# Patient Record
Sex: Male | Born: 1965
Health system: Southern US, Community
[De-identification: ages and names within clinical notes are randomized; demographics above are authoritative.]

## PROBLEM LIST (undated history)

## (undated) DIAGNOSIS — C9200 Acute myeloblastic leukemia, not having achieved remission: Secondary | ICD-10-CM

## (undated) DIAGNOSIS — Z9484 Stem cells transplant status: Secondary | ICD-10-CM

## (undated) DIAGNOSIS — D89813 Graft-versus-host disease, unspecified: Secondary | ICD-10-CM

## (undated) HISTORY — DX: Graft-versus-host disease, unspecified: D89.813

## (undated) HISTORY — DX: Stem cells transplant status: Z94.84

## (undated) HISTORY — DX: Acute myeloblastic leukemia, not having achieved remission: C92.00

---

## 2021-01-20 ENCOUNTER — Telehealth: Payer: Self-pay | Admitting: Hematology and Oncology

## 2021-01-20 NOTE — Telephone Encounter (Signed)
I received a staff msg to schedule a new pt appt w/Dr. Lorenso Courier for AML. Referral is from Dr. Zenda Alpers at [facility] has been scheduled to see Dr. Lorenso Courier on 3/10 at 1pm. Appt date and time has been given to the pt's emergency contact. Aware to arrive 20 minutes early.

## 2021-01-30 ENCOUNTER — Encounter: Payer: Self-pay | Admitting: Hematology and Oncology

## 2021-01-30 ENCOUNTER — Inpatient Hospital Stay: Payer: BC Managed Care – PPO

## 2021-01-30 ENCOUNTER — Other Ambulatory Visit: Payer: Self-pay | Admitting: *Deleted

## 2021-01-30 ENCOUNTER — Other Ambulatory Visit: Payer: Self-pay

## 2021-01-30 ENCOUNTER — Inpatient Hospital Stay: Payer: BC Managed Care – PPO | Attending: Hematology and Oncology | Admitting: Hematology and Oncology

## 2021-01-30 VITALS — BP 99/66 | HR 93 | Temp 98.1°F | Resp 17 | Ht 72.0 in | Wt 172.3 lb

## 2021-01-30 DIAGNOSIS — Z800 Family history of malignant neoplasm of digestive organs: Secondary | ICD-10-CM | POA: Diagnosis not present

## 2021-01-30 DIAGNOSIS — C9200 Acute myeloblastic leukemia, not having achieved remission: Secondary | ICD-10-CM

## 2021-01-30 DIAGNOSIS — K5900 Constipation, unspecified: Secondary | ICD-10-CM | POA: Insufficient documentation

## 2021-01-30 DIAGNOSIS — Z8042 Family history of malignant neoplasm of prostate: Secondary | ICD-10-CM | POA: Insufficient documentation

## 2021-01-30 DIAGNOSIS — R519 Headache, unspecified: Secondary | ICD-10-CM | POA: Insufficient documentation

## 2021-01-30 DIAGNOSIS — Z79899 Other long term (current) drug therapy: Secondary | ICD-10-CM | POA: Insufficient documentation

## 2021-01-30 DIAGNOSIS — Z9484 Stem cells transplant status: Secondary | ICD-10-CM | POA: Insufficient documentation

## 2021-01-30 LAB — CBC WITH DIFFERENTIAL (CANCER CENTER ONLY)
Abs Immature Granulocytes: 0.01 10*3/uL (ref 0.00–0.07)
Basophils Absolute: 0 10*3/uL (ref 0.0–0.1)
Basophils Relative: 0 %
Eosinophils Absolute: 0 10*3/uL (ref 0.0–0.5)
Eosinophils Relative: 1 %
HCT: 22.8 % — ABNORMAL LOW (ref 39.0–52.0)
Hemoglobin: 8.2 g/dL — ABNORMAL LOW (ref 13.0–17.0)
Immature Granulocytes: 1 %
Lymphocytes Relative: 19 %
Lymphs Abs: 0.2 10*3/uL — ABNORMAL LOW (ref 0.7–4.0)
MCH: 37.4 pg — ABNORMAL HIGH (ref 26.0–34.0)
MCHC: 36 g/dL (ref 30.0–36.0)
MCV: 104.1 fL — ABNORMAL HIGH (ref 80.0–100.0)
Monocytes Absolute: 0.1 10*3/uL (ref 0.1–1.0)
Monocytes Relative: 5 %
Neutro Abs: 0.8 10*3/uL — ABNORMAL LOW (ref 1.7–7.7)
Neutrophils Relative %: 74 %
Platelet Count: 35 10*3/uL — ABNORMAL LOW (ref 150–400)
RBC: 2.19 MIL/uL — ABNORMAL LOW (ref 4.22–5.81)
RDW: 18.6 % — ABNORMAL HIGH (ref 11.5–15.5)
WBC Count: 1.1 10*3/uL — ABNORMAL LOW (ref 4.0–10.5)
nRBC: 0 % (ref 0.0–0.2)

## 2021-01-30 LAB — CMP (CANCER CENTER ONLY)
ALT: 94 U/L — ABNORMAL HIGH (ref 0–44)
AST: 61 U/L — ABNORMAL HIGH (ref 15–41)
Albumin: 3.4 g/dL — ABNORMAL LOW (ref 3.5–5.0)
Alkaline Phosphatase: 115 U/L (ref 38–126)
Anion gap: 5 (ref 5–15)
BUN: 17 mg/dL (ref 6–20)
CO2: 29 mmol/L (ref 22–32)
Calcium: 8.7 mg/dL — ABNORMAL LOW (ref 8.9–10.3)
Chloride: 101 mmol/L (ref 98–111)
Creatinine: 0.79 mg/dL (ref 0.61–1.24)
GFR, Estimated: >60 mL/min (ref >60)
Glucose, Bld: 151 mg/dL — ABNORMAL HIGH (ref 70–99)
Potassium: 4.9 mmol/L (ref 3.5–5.1)
Sodium: 135 mmol/L (ref 135–145)
Total Bilirubin: 1.3 mg/dL — ABNORMAL HIGH (ref 0.3–1.2)
Total Protein: 5.8 g/dL — ABNORMAL LOW (ref 6.5–8.1)

## 2021-01-30 LAB — SAMPLE TO BLOOD BANK

## 2021-01-30 LAB — LACTATE DEHYDROGENASE: LDH: 180 U/L (ref 98–192)

## 2021-01-30 LAB — MAGNESIUM: Magnesium: 1 mg/dL — ABNORMAL LOW (ref 1.7–2.4)

## 2021-01-30 LAB — URIC ACID: Uric Acid, Serum: 4.9 mg/dL (ref 3.7–8.6)

## 2021-01-30 MED ORDER — URSODIOL 300 MG PO CAPS: 300.0000 mg | ORAL_CAPSULE | Freq: Three times a day (TID) | ORAL | 2 refills | Status: DC

## 2021-01-30 MED ORDER — HYDROCORTISONE 10 MG PO TABS: 10.0000 mg | ORAL_TABLET | Freq: Two times a day (BID) | ORAL | 2 refills | Status: DC

## 2021-01-30 MED FILL — HYDROCORTISONE 10 MG TABLET: 10 | 30 days supply | Qty: 60 | Fill #0

## 2021-01-30 NOTE — Progress Notes (Signed)
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 Was initially treated at [facility]. 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 Started on Venetoclax/Decitabine 01/16/2021: established care with Dr. Linus Orn at [facility] 01/30/2021: established care with Dr. Lorenso Courier at [facility] for co-managed care.   CHIEF COMPLAINTS/PURPOSE OF CONSULTATION:  "Co-managed Care for AML "  HISTORY OF PRESENTING ILLNESS:  William Fritz 55 y.o. male with medical history significant for AML s/p MRD PBSCT with relapse who presents to establish for comanage care.  On review of the previous records William Fritz was initially diagnosed with AML in [place] in October 2020.  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  He underwent induction chemotherapy and subsequently went underwent a matched related donor (sister) stem cell transplant performed at [facility] on 12/26/2019.  Unfortunately in October 2021 the patient had a relapse and was started on venetoclax and decitabine therapy.  Due to concern for his ability to care for himself he needs to be in the care of another individual and he was moved to [place] to live with his current companion.  He established care with Dr. Linus Orn on 01/16/2021 at [facility], however due to the long driving distance from [place] the patient requested to establish with a hospital closer to home.  As such we happily agreed to comanage his care with Dr. Lin[facility] at [facility].  On exam today William Fritz is accompanied by his companion.  He does appear to have some degree of cognitive decline.  His companion answers  many of the questions for him.  He reports that he has been well since he moved [place] has been having no issues with nausea, vomiting, or diarrhea.  He currently denies any fevers, chills, sweats.  He reports that his appetite is good and that his weight has been stable.  He does occasionally have some constipation.  Otherwise he has been tolerating his decitabine and Venclexta well with no major issues.  A full 10 point ROS is listed below.  On further discussion he notes that his maternal grandmother died of stomach cancer and his father died of prostate cancer.  His mother has a history of Alzheimer's disease.  He currently has no children and he has a healthy sister who was his bone marrow donor.  He is a never smoker and never drinker.  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 No pertinent surgical history.  SOCIAL HISTORY: Social History   Socioeconomic History   Marital status: Single    Spouse name: Not on file   Number of children: 0   Years of education: Not on file   Highest education level: Not on file  Occupational History   Not on file  Tobacco Use   Smoking status: Never Smoker   Smokeless tobacco: Never Used  Substance and Sexual Activity  Alcohol use: Never   Drug use: Not on file   Sexual activity: Not on file  Other Topics Concern   Not on file  Social History Narrative   Not on file   Social Determinants of Health   Financial Resource Strain: Medium Risk   Difficulty of Paying Living Expenses: Somewhat hard  Food Insecurity: No Food Insecurity   Worried About Running Out of Food in the Last Year: Never true   Ran Out of Food in the Last  Year: Never true  Transportation Needs: Unmet Transportation Needs   Lack of Transportation (Medical): Yes   Lack of Transportation (Non-Medical): Yes  Physical Activity: Not on file  Stress: Not on file  Social Connections: Not on file  Intimate Partner Violence: Not on file    FAMILY HISTORY: Family History  Problem Relation Age of Onset   Alzheimer's disease Mother    Prostate cancer Father    Healthy Sister    Gastric cancer Maternal Grandmother     ALLERGIES:  has no allergies on file.  MEDICATIONS:  Current Outpatient Medications  Medication Sig Dispense Refill   calcium carbonate (OS-CAL) 1250 (500 Ca) MG chewable tablet Chew 1 tablet by mouth 2 (two) times daily.     Cholecalciferol 50 MCG (2000 UT) TABS Take 1 tablet by mouth daily.     cyanocobalamin 1000 MCG tablet Take 1 tablet by mouth daily.     dronabinol (MARINOL) 2.5 MG capsule      famciclovir (FAMVIR) 500 MG tablet Take 1 tablet by mouth daily.     hydrocortisone (CORTEF) 10 MG tablet Take 1 tablet (10 mg total) by mouth 2 (two) times daily. 60 tablet 2   magnesium oxide (MAG-OX) 400 MG tablet Take 800 mg by mouth 3 (three) times daily.     midodrine (PROAMATINE) 10 MG tablet Take 1 tablet by mouth 3 (three) times daily.     mirtazapine (REMERON) 15 MG tablet Take 1 tablet by mouth at bedtime.     omeprazole (PRILOSEC) 40 MG capsule Take 1 capsule by mouth 2 (two) times daily.     ondansetron (ZOFRAN) 4 MG tablet Take 1 tablet by mouth every 8 (eight) hours as needed.     ursodiol (ACTIGALL) 300 MG capsule Take 1 capsule (300 mg total) by mouth 3 (three) times daily. 90 capsule 2   venetoclax (VENCLEXTA) 100 MG tablet Take by mouth.     nystatin (MYCOSTATIN) 100000 UNIT/ML suspension Take by mouth.     prochlorperazine (COMPAZINE) 10 MG tablet Take 10 mg by mouth every 6 (six) hours as needed.     tacrolimus (PROGRAF) 0.5 MG capsule Take 5 capsules by mouth every morning. 5 capsules  every morning and 6 capsules every evening     No current facility-administered medications for this visit.   Facility-Administered Medications Ordered in Other Visits  Medication Dose Route Frequency Provider Last Rate Last Admin   magnesium sulfate IVPB 4 g 100 mL  4 g Intravenous Once Orson Slick, MD        REVIEW OF SYSTEMS:   Constitutional: ( - ) fevers, ( - )  chills , ( - ) night sweats Eyes: ( - ) blurriness of vision, ( - ) double vision, ( - ) watery eyes Ears, nose, mouth, throat, and face: ( - ) mucositis, ( - ) sore throat Respiratory: ( - ) cough, ( - ) dyspnea, ( - ) wheezes Cardiovascular: ( - ) palpitation, ( - )  chest discomfort, ( - ) lower extremity swelling Gastrointestinal:  ( - ) nausea, ( - ) heartburn, ( - ) change in bowel habits Skin: ( - ) abnormal skin rashes Lymphatics: ( - ) new lymphadenopathy, ( - ) easy bruising Neurological: ( - ) numbness, ( - ) tingling, ( - ) new weaknesses Behavioral/Psych: ( - ) mood change, ( - ) new changes  All other systems were reviewed with the patient and are negative.  PHYSICAL EXAMINATION: ECOG PERFORMANCE STATUS: 1 - Symptomatic but completely ambulatory  Vitals:   01/30/21 1256  BP: 99/66  Pulse: 93  Resp: 17  Temp: 98.1 F (36.7 C)  SpO2: 100%   Filed Weights   01/30/21 1256  Weight: 172 lb 4.8 oz (78.2 kg)    GENERAL: chronically ill appearing middle aged male in NAD  SKIN: skin color, texture, turgor are normal, no rashes or significant lesions EYES: conjunctiva are pink and non-injected, sclera clear OROPHARYNX: no exudate, no erythema; lips, buccal mucosa, and tongue normal  LUNGS: clear to auscultation and percussion with normal breathing effort HEART: regular rate & rhythm and no murmurs and no lower extremity edema Musculoskeletal: no cyanosis of digits and no clubbing  PSYCH: alert & oriented x 3, fluent speech NEURO: no focal motor/sensory deficits  LABORATORY DATA:  I have reviewed  the data as listed CBC Latest Ref Rng & Units 01/30/2021  WBC 4.0 - 10.5 K/uL 1.1(L)  Hemoglobin 13.0 - 17.0 g/dL 8.2(L)  Hematocrit 39.0 - 52.0 % 22.8(L)  Platelets 150 - 400 K/uL 35(L)    CMP Latest Ref Rng & Units 01/30/2021  Glucose 70 - 99 mg/dL 151(H)  BUN 6 - 20 mg/dL 17  Creatinine 0.61 - 1.24 mg/dL 0.79  Sodium 135 - 145 mmol/L 135  Potassium 3.5 - 5.1 mmol/L 4.9  Chloride 98 - 111 mmol/L 101  CO2 22 - 32 mmol/L 29  Calcium 8.9 - 10.3 mg/dL 8.7(L)  Total Protein 6.5 - 8.1 g/dL 5.8(L)  Total Bilirubin 0.3 - 1.2 mg/dL 1.3(H)  Alkaline Phos 38 - 126 U/L 115  AST 15 - 41 U/L 61(H)  ALT 0 - 44 U/L 94(H)    RADIOGRAPHIC STUDIES: No results found.  ASSESSMENT & PLAN William Fritz 55 y.o. male with medical history significant for AML s/p MRD PBSCT with relapse who presents to establish for comanage care.  After review the labs, the records, schedule the patient the findings most consistent with a relapsed AML status post stem cell transplant.  He is currently on palliative chemotherapy with decitabine and venetoclax.  He received his last cycle at [facility] from 2/28 to 3/4. His next cycle (Cycle 5) is due on 02/17/2021 and we will administer this here.   Today we discussed the concept of comanaged care.  Comanaged care is when the patient has a local primary provider who administers local support and therapy while expert advice and treatment recommendations are rendered by a cancer specialist at a large academic center.  In this arrangement we provide local support, labs, treatment, and emergency visits, however the major decisions regarding the course of treatment are decided by a physician at an academic medical center.  The patient voiced his understanding of comanaged care and was agreeable to proceeding forward with this care model.  # AML s/p MRD PBSCT with Relapse --patient is agreeable to continuing with co-managed care between myself and Dr. Linus Orn of  [facility] --agree with continued Decitabine/Venetoclax. Next cycle to  be administered on 02/17/2021 --patient to see Dr. Linus Orn with labs and bone marrow biopsy on 02/13/2021. We will plan to provide weekly labs (next labs 02/06/2021). The next labs will be on Monday prior to start of next cycle of decitabine.  --RTC on Monday 02/17/2021 prior to start of next cycle of decitabine.   Orders Placed This Encounter  Procedures   CBC with Differential ([place] Only)    Standing Status:   Standing    Number of Occurrences:   52    Standing Expiration Date:   01/30/2022   CMP ([place] only)    Standing Status:   Standing    Number of Occurrences:   52    Standing Expiration Date:   01/30/2022   Lactate dehydrogenase (LDH)    Standing Status:   Standing    Number of Occurrences:   52    Standing Expiration Date:   01/30/2022   Uric acid    Standing Status:   Standing    Number of Occurrences:   52    Standing Expiration Date:   01/30/2022   Magnesium    Standing Status:   Standing    Number of Occurrences:   52    Standing Expiration Date:   01/30/2022    All questions were answered. The patient knows to call the clinic with any problems, questions or concerns.  A total of more than 60 minutes were spent on this encounter and over half of that time was spent on counseling and coordination of care as outlined above.   Ledell Peoples, MD Department of Hematology/Oncology [place] at [facility] Phone: [phone] Pager: [phone] Email: [email or web address]  01/31/2021 12:00 PM

## 2021-01-30 NOTE — Progress Notes (Signed)
START OFF PATHWAY REGIMEN - Other   [id number]:Decitabine:   A cycle is every 28 days:     Decitabine   **Always confirm dose/schedule in your pharmacy ordering system**  Patient Characteristics: Intent of Therapy: Non-Curative / Palliative Intent, Discussed with Patient 

## 2021-01-30 NOTE — Progress Notes (Signed)
IV team paged to assess pt's central line. Pt has triple lumen central line. He has had this for 1.5 years. It was placed @ [facility] in [place]. All lumens flush easily with good blood return. Labs obtained from purple port-largest port. Flush with saline/heparin after. Protocol for care to be listed on 01/31/21 in Patient Care Coordination Note. Pt uses blue or green caps over the luer lock caps. He plans to bring some in to be used here for weekly central line care.

## 2021-01-31 ENCOUNTER — Ambulatory Visit (HOSPITAL_COMMUNITY)
Admission: RE | Admit: 2021-01-31 | Discharge: 2021-01-31 | Disposition: A | Discharge status: Home / Self Care | Payer: BC Managed Care – PPO | Source: Ambulatory Visit | Attending: Hematology and Oncology | Admitting: Hematology and Oncology

## 2021-01-31 ENCOUNTER — Inpatient Hospital Stay: Payer: BC Managed Care – PPO

## 2021-01-31 ENCOUNTER — Encounter: Payer: Self-pay | Admitting: General Practice

## 2021-01-31 ENCOUNTER — Other Ambulatory Visit: Payer: Self-pay | Admitting: Hematology and Oncology

## 2021-01-31 ENCOUNTER — Other Ambulatory Visit: Payer: Self-pay | Admitting: Medical Oncology

## 2021-01-31 VITALS — BP 98/60 | HR 90 | Temp 97.1°F | Resp 16 | Wt 172.3 lb

## 2021-01-31 DIAGNOSIS — C9200 Acute myeloblastic leukemia, not having achieved remission: Secondary | ICD-10-CM | POA: Diagnosis not present

## 2021-01-31 DIAGNOSIS — Z452 Encounter for adjustment and management of vascular access device: Secondary | ICD-10-CM

## 2021-01-31 MED ORDER — HEPARIN SOD (PORK) LOCK FLUSH 100 UNIT/ML IV SOLN
250.0000 [IU] | Freq: Once | INTRAVENOUS | Status: AC
  Administered 2021-01-31: 50 [IU] via INTRAVENOUS
  Filled 2021-01-31: qty 5

## 2021-01-31 MED ORDER — SODIUM CHLORIDE 0.9% FLUSH
3.0000 mL | INTRAVENOUS | Status: DC | PRN
  Administered 2021-01-31: 3 mL via INTRAVENOUS
  Filled 2021-01-31: qty 10

## 2021-01-31 MED ORDER — HEPARIN SOD (PORK) LOCK FLUSH 100 UNIT/ML IV SOLN
50.0000 [IU] | Freq: Once | INTRAVENOUS | Status: DC
  Filled 2021-01-31: qty 5

## 2021-01-31 MED ORDER — MAGNESIUM SULFATE 4 GM/100ML IV SOLN
4.0000 g | Freq: Once | INTRAVENOUS | Status: DC
  Filled 2021-01-31: qty 100

## 2021-01-31 MED ORDER — SODIUM CHLORIDE 0.9 % IV SOLN
INTRAVENOUS | Status: DC
  Filled 2021-01-31: qty 250

## 2021-01-31 MED ORDER — MAGNESIUM SULFATE 4 GM/100ML IV SOLN
4.0000 g | Freq: Once | INTRAVENOUS | Status: AC
  Administered 2021-01-31: 4 g via INTRAVENOUS
  Filled 2021-01-31: qty 100

## 2021-01-31 NOTE — Patient Instructions (Signed)
Hypomagnesemia Hypomagnesemia is a condition in which the level of magnesium in the blood is low. Magnesium is a mineral that is found in many foods. It is used in many different processes in the body. Hypomagnesemia can affect every organ in the body. In severe cases, it can cause life-threatening problems. What are the causes? This condition may be caused by:  Not getting enough magnesium in your diet.  Malnutrition.  Problems with absorbing magnesium from the intestines.  Dehydration.  Alcohol abuse.  Vomiting.  Severe or chronic diarrhea.  Some medicines, including medicines that make you urinate more (diuretics).  Certain diseases, such as kidney disease, diabetes, celiac disease, and overactive thyroid. What are the signs or symptoms? Symptoms of this condition include:  Loss of appetite.  Nausea and vomiting.  Involuntary shaking or trembling of a body part (tremor).  Muscle weakness.  Tingling in the arms and legs.  Sudden tightening of muscles (muscle spasms).  Confusion.  Psychiatric issues, such as depression, irritability, or psychosis.  A feeling of fluttering of the heart.  Seizures. These symptoms are more severe if magnesium levels drop suddenly. How is this diagnosed? This condition may be diagnosed based on:  Your symptoms and medical history.  A physical exam.  Blood and urine tests. How is this treated? Treatment depends on the cause and the severity of the condition. It may be treated with:  A magnesium supplement. This can be taken in pill form. If the condition is severe, magnesium is usually given through an IV.  Changes to your diet. You may be directed to eat foods that have a lot of magnesium, such as green leafy vegetables, peas, beans, and nuts.  Stopping any intake of alcohol.   Follow these instructions at home:  Make sure that your diet includes foods with magnesium. Foods that have a lot of magnesium in them  include: ? Green leafy vegetables, such as spinach and broccoli. ? Beans and peas. ? Nuts and seeds, such as almonds and sunflower seeds. ? Whole grains, such as whole grain bread and fortified cereals.  Take magnesium supplements if your health care provider tells you to do that. Take them as directed.  Take over-the-counter and prescription medicines only as told by your health care provider.  Have your magnesium levels monitored as told by your health care provider.  When you are active, drink fluids that contain electrolytes.  Avoid drinking alcohol.  Keep all follow-up visits as told by your health care provider. This is important.      Contact a health care provider if:  You get worse instead of better.  Your symptoms return. Get help right away if you:  Develop severe muscle weakness.  Have trouble breathing.  Feel that your heart is racing. Summary  Hypomagnesemia is a condition in which the level of magnesium in the blood is low.  Hypomagnesemia can affect every organ in the body.  Treatment may include eating more foods that contain magnesium, taking magnesium supplements, and not drinking alcohol.  Have your magnesium levels monitored as told by your health care provider. This information is not intended to replace advice given to you by your health care provider. Make sure you discuss any questions you have with your health care provider. Document Revised: 04/11/2020 Document Reviewed: 04/11/2020 Elsevier Patient Education  2021 Elsevier Inc.  

## 2021-01-31 NOTE — Progress Notes (Signed)
[place] CSW Progress Notes  Called patient at request of [employer] copy.  Patient needs help w transportation, Adv Directives and other legal matters.  Is new to [facility], will have comanaged care w [facility].  [place]oved here [place], lives w significant other who is heavily involved in his care.  Referred to [facility] Transportation for help getting to/from [facility] appointments.  Registered him for 3/14 [facility].  Provided information on legal options for personal wills in [place].  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  He is ineligible for SSI due to being over assets.  Provided information on Triage Cancer, a national organization which helps people with financial and legal aspects of cancer.  Suggested they contact Leukemia and Lymphoma Society for additional support.  They would like to be connected w [place] asked to connect w him.  CSW will continue to connect w patient - will call in one week.  Edwyna Shell, LCSW Clinical Social Worker Phone:  [phone]

## 2021-02-03 ENCOUNTER — Other Ambulatory Visit: Payer: Self-pay

## 2021-02-03 ENCOUNTER — Other Ambulatory Visit: Payer: BC Managed Care – PPO | Admitting: *Deleted

## 2021-02-03 ENCOUNTER — Inpatient Hospital Stay: Payer: BC Managed Care – PPO | Admitting: *Deleted

## 2021-02-03 DIAGNOSIS — C9200 Acute myeloblastic leukemia, not having achieved remission: Secondary | ICD-10-CM

## 2021-02-03 NOTE — Progress Notes (Signed)
[place] Social Work  Clinical Social Work was referred to review and complete healthcare advance directives.  Clinical Social Worker met with patient and patients significant other in [facility] Patient and [employer].  The patient designated Retta Mac der Matilde Bash as their primary healthcare agent and Su Grand as their secondary agent.  Patient also completed healthcare living will.    Clinical Social Worker notarized documents and made copies for patient/family. Clinical Social Worker will send documents to medical records to be scanned into patient's chart. Clinical Social Worker encouraged patient/family to contact with any additional questions or concerns.  Johnnye Lana, MSW, LCSW, OSW-C Clinical Social Worker [facility] [phone]

## 2021-02-04 ENCOUNTER — Other Ambulatory Visit: Payer: Self-pay | Admitting: Hematology and Oncology

## 2021-02-04 ENCOUNTER — Encounter: Payer: Self-pay | Admitting: Hematology and Oncology

## 2021-02-04 MED ORDER — MIDODRINE HCL 10 MG PO TABS: 10.0000 mg | ORAL_TABLET | Freq: Three times a day (TID) | ORAL | 1 refills | Status: DC

## 2021-02-04 MED ORDER — HYDROCORTISONE 10 MG PO TABS: 10.0000 mg | ORAL_TABLET | Freq: Two times a day (BID) | ORAL | 2 refills | Status: DC

## 2021-02-04 MED ORDER — MIRTAZAPINE 15 MG PO TABS: 15.0000 mg | ORAL_TABLET | Freq: Every day | ORAL | 1 refills | Status: DC

## 2021-02-04 MED ORDER — CYANOCOBALAMIN 1000 MCG PO TABS: 1000.0000 ug | ORAL_TABLET | Freq: Every day | ORAL | 1 refills | Status: AC

## 2021-02-04 MED ORDER — CALCIUM CARBONATE 1250 (500 CA) MG PO CHEW: 1.0000 | CHEWABLE_TABLET | Freq: Two times a day (BID) | ORAL | 1 refills | Status: DC

## 2021-02-04 MED ORDER — CHOLECALCIFEROL 50 MCG (2000 UT) PO TABS: 1.0000 | ORAL_TABLET | Freq: Every day | ORAL | 1 refills | Status: DC

## 2021-02-04 MED ORDER — FAMCICLOVIR 500 MG PO TABS: 500.0000 mg | ORAL_TABLET | Freq: Every day | ORAL | 1 refills | Status: DC

## 2021-02-04 MED ORDER — OMEPRAZOLE 40 MG PO CPDR: 40.0000 mg | DELAYED_RELEASE_CAPSULE | Freq: Two times a day (BID) | ORAL | 1 refills | Status: DC

## 2021-02-05 ENCOUNTER — Telehealth: Payer: Self-pay | Admitting: Hematology and Oncology

## 2021-02-05 NOTE — Telephone Encounter (Signed)
Called patient regarding 03/18 appointments, left a contact number for patient to reach.

## 2021-02-05 NOTE — Telephone Encounter (Signed)
Called and spoke with William Fritz to inform of 3/18 appts and also to let advise that we are waiting for approval for next weeks appts. C

## 2021-02-06 ENCOUNTER — Encounter: Payer: Self-pay | Admitting: General Practice

## 2021-02-06 ENCOUNTER — Other Ambulatory Visit: Payer: Self-pay | Admitting: Hematology and Oncology

## 2021-02-06 NOTE — Progress Notes (Signed)
[place] CSW Progress Notes  Message from [employer] stating that significant other wants SW to determine status of shower chair ordered for patient by provider at [facility], nurse navigator, [phone]).  CSW left T Loletha Grayer a VM requesting that she call significant other w an update, also left VM for Ms Denton Lank asking her to let us know when she receives the shower chair.  CSW is unable to be of further assistance, as the DME company is unknown.  Edwyna Shell, LCSW Clinical Social Worker Phone:  [phone]

## 2021-02-07 ENCOUNTER — Inpatient Hospital Stay: Payer: BC Managed Care – PPO

## 2021-02-07 ENCOUNTER — Encounter: Payer: Self-pay | Admitting: General Practice

## 2021-02-07 ENCOUNTER — Encounter: Payer: Self-pay | Admitting: Hematology and Oncology

## 2021-02-07 ENCOUNTER — Telehealth: Payer: Self-pay | Admitting: *Deleted

## 2021-02-07 ENCOUNTER — Other Ambulatory Visit: Payer: Self-pay | Admitting: *Deleted

## 2021-02-07 ENCOUNTER — Other Ambulatory Visit: Payer: Self-pay

## 2021-02-07 ENCOUNTER — Inpatient Hospital Stay (HOSPITAL_BASED_OUTPATIENT_CLINIC_OR_DEPARTMENT_OTHER): Payer: BC Managed Care – PPO | Admitting: Hematology and Oncology

## 2021-02-07 VITALS — BP 103/80 | HR 83 | Temp 98.2°F | Resp 18 | Ht 72.0 in | Wt 176.4 lb

## 2021-02-07 DIAGNOSIS — C9200 Acute myeloblastic leukemia, not having achieved remission: Secondary | ICD-10-CM

## 2021-02-07 DIAGNOSIS — Z7189 Other specified counseling: Secondary | ICD-10-CM | POA: Diagnosis not present

## 2021-02-07 LAB — CMP (CANCER CENTER ONLY)
ALT: 151 U/L — ABNORMAL HIGH (ref 0–44)
AST: 79 U/L — ABNORMAL HIGH (ref 15–41)
Albumin: 3.3 g/dL — ABNORMAL LOW (ref 3.5–5.0)
Alkaline Phosphatase: 124 U/L (ref 38–126)
Anion gap: 5 (ref 5–15)
BUN: 20 mg/dL (ref 6–20)
CO2: 29 mmol/L (ref 22–32)
Calcium: 8.7 mg/dL — ABNORMAL LOW (ref 8.9–10.3)
Chloride: 102 mmol/L (ref 98–111)
Creatinine: 0.78 mg/dL (ref 0.61–1.24)
GFR, Estimated: >60 mL/min (ref >60)
Glucose, Bld: 132 mg/dL — ABNORMAL HIGH (ref 70–99)
Potassium: 4.1 mmol/L (ref 3.5–5.1)
Sodium: 136 mmol/L (ref 135–145)
Total Bilirubin: 1.4 mg/dL — ABNORMAL HIGH (ref 0.3–1.2)
Total Protein: 5.6 g/dL — ABNORMAL LOW (ref 6.5–8.1)

## 2021-02-07 LAB — CBC WITH DIFFERENTIAL (CANCER CENTER ONLY)
Abs Immature Granulocytes: 0 10*3/uL (ref 0.00–0.07)
Basophils Absolute: 0 10*3/uL (ref 0.0–0.1)
Basophils Relative: 0 %
Eosinophils Absolute: 0 10*3/uL (ref 0.0–0.5)
Eosinophils Relative: 0 %
HCT: 15.2 % — ABNORMAL LOW (ref 39.0–52.0)
Hemoglobin: 5.6 g/dL — CL (ref 13.0–17.0)
Immature Granulocytes: 0 %
Lymphocytes Relative: 95 %
Lymphs Abs: 0.2 10*3/uL — ABNORMAL LOW (ref 0.7–4.0)
MCH: 37.8 pg — ABNORMAL HIGH (ref 26.0–34.0)
MCHC: 36.8 g/dL — ABNORMAL HIGH (ref 30.0–36.0)
MCV: 102.7 fL — ABNORMAL HIGH (ref 80.0–100.0)
Monocytes Absolute: 0 10*3/uL — ABNORMAL LOW (ref 0.1–1.0)
Monocytes Relative: 0 %
Neutro Abs: 0 10*3/uL — CL (ref 1.7–7.7)
Neutrophils Relative %: 5 %
Platelet Count: 6 10*3/uL — CL (ref 150–400)
RBC: 1.48 MIL/uL — ABNORMAL LOW (ref 4.22–5.81)
RDW: 18.8 % — ABNORMAL HIGH (ref 11.5–15.5)
WBC Count: 0.2 10*3/uL — CL (ref 4.0–10.5)
nRBC: 0 % (ref 0.0–0.2)

## 2021-02-07 LAB — MAGNESIUM: Magnesium: 1.1 mg/dL — ABNORMAL LOW (ref 1.7–2.4)

## 2021-02-07 LAB — PREPARE RBC (CROSSMATCH)

## 2021-02-07 LAB — LACTATE DEHYDROGENASE: LDH: 190 U/L (ref 98–192)

## 2021-02-07 LAB — ABO/RH: ABO/RH(D): A NEG

## 2021-02-07 LAB — URIC ACID: Uric Acid, Serum: 4.4 mg/dL (ref 3.7–8.6)

## 2021-02-07 MED ORDER — ACETAMINOPHEN 325 MG PO TABS
ORAL_TABLET | ORAL | Status: AC
  Filled 2021-02-07: qty 2

## 2021-02-07 MED ORDER — SODIUM CHLORIDE 0.9% FLUSH
10.0000 mL | Freq: Once | INTRAVENOUS | Status: AC
  Administered 2021-02-07: 10 mL
  Filled 2021-02-07: qty 10

## 2021-02-07 MED ORDER — SODIUM CHLORIDE 0.9% FLUSH
10.0000 mL | INTRAVENOUS | Status: DC | PRN
  Filled 2021-02-07: qty 10

## 2021-02-07 MED ORDER — HEPARIN SOD (PORK) LOCK FLUSH 100 UNIT/ML IV SOLN
50.0000 [IU] | Freq: Once | INTRAVENOUS | Status: AC
  Administered 2021-02-07: 50 [IU]
  Filled 2021-02-07: qty 5

## 2021-02-07 MED ORDER — MAGNESIUM SULFATE 2 GM/50ML IV SOLN
2.0000 g | Freq: Once | INTRAVENOUS | Status: AC
  Administered 2021-02-07: 2 g via INTRAVENOUS

## 2021-02-07 MED ORDER — MAGNESIUM SULFATE 2 GM/50ML IV SOLN
INTRAVENOUS | Status: AC
  Filled 2021-02-07: qty 50

## 2021-02-07 MED ORDER — SODIUM CHLORIDE 0.9% FLUSH
10.0000 mL | Freq: Once | INTRAVENOUS | Status: DC
  Filled 2021-02-07: qty 10

## 2021-02-07 MED ORDER — SODIUM CHLORIDE 0.9 % IV SOLN
INTRAVENOUS | Status: DC
  Filled 2021-02-07: qty 250

## 2021-02-07 MED ORDER — SODIUM CHLORIDE 0.9% IV SOLUTION
250.0000 mL | Freq: Once | INTRAVENOUS | Status: DC
  Filled 2021-02-07: qty 250

## 2021-02-07 MED ORDER — ACETAMINOPHEN 325 MG PO TABS
650.0000 mg | ORAL_TABLET | Freq: Once | ORAL | Status: AC
  Administered 2021-02-07: 650 mg via ORAL

## 2021-02-07 NOTE — Progress Notes (Signed)
[place] Spiritual Care Note  Met Mr Milby in [facility] and followed up for a pastoral visit today in [facility]. His faith (from [employer] and Buddhist backgrounds, among others) and spiritual practices, such as singing and journaling/poetry writing, are very important to him. Vibrant, vital worship brings him encouragement.  Also sent an email with links to favorite hymns, poetry, and other sources of encouragement, particularly because he spends a large portion of his day alone and values connections. We plan to follow up at his next treatment on 2/28.   [place], [place], [facility] Pager [phone] Voicemail [phone]

## 2021-02-07 NOTE — Patient Instructions (Signed)
Thrombocytopenia Thrombocytopenia is a condition in which you have a low number of platelets in your blood. Platelets are also called thrombocytes. Platelets are tiny cells in the blood. When you bleed, they clump together at the cut or injury to stop the bleeding. This is called blood clotting. Not having enough platelets can cause bleeding problems. Some cases of thrombocytopenia are mild while others are more severe. What are the causes? This condition may be caused by:  Decreased production of platelets. This may be caused by: ? Aplastic anemia. This is when your bone marrow stops making blood cells. ? Cancer in the bone marrow. ? Certain medicines, including chemotherapy. ? Infection in the bone marrow. ? Drinking a lot of alcohol.  Increased destruction of platelets. This may be caused by: ? Certain immune diseases. ? Certain medicines. ? Certain blood clotting disorders. ? Certain inherited disorders. ? Certain bleeding disorders. ? Pregnancy. ? Having an enlarged spleen (hypersplenism). In hypersplenism, the spleen gathers up platelets from circulation. This means that the platelets are not available to help with blood clotting. The spleen can be enlarged because of cirrhosis or other conditions. What are the signs or symptoms? Symptoms of this condition are the result of poor blood clotting. They will vary depending on how low the platelet counts are. Symptoms may include:  Abnormal bleeding.  Nosebleeds.  Heavy menstrual periods.  Blood in the urine or stool (feces).  A purplish discoloration in the skin (purpura).  Bruising.  A rash that looks like pinpoint, purplish-red spots (petechiae) on the skin and mucous membranes. How is this diagnosed? This condition may be diagnosed with blood tests and a physical exam. Sometimes, a sample of bone marrow may be removed to look for the original cells (megakaryocytes) that make platelets. Other tests may be needed depending on  the cause.   How is this treated? Treatment for this condition depends on the cause. Treatment options may include:  Treatment of another condition that is causing the low platelet count.  Medicines to help protect your platelets from being destroyed.  A replacement (transfusion) of platelets to stop or prevent bleeding.  Surgery to remove the spleen. Follow these instructions at home: Activity  Avoid activities that could cause injury or bruising, and follow instructions about how to prevent falls.  Take extra care not to cut yourself when you shave or when you use scissors, needles, knives, and other tools.  Take extra care to protect yourself from burns when ironing or cooking. General instructions  Check your skin and the inside of your mouth for bruising or bleeding as told by your health care provider.  Check your spit (sputum), urine, and stool for blood as told by your health care provider.  Do not drink alcohol.  Take over-the-counter and prescription medicines only as told by your health care provider.  Do not take any medicines that have aspirin or NSAIDs in them. These medicines can thin your blood and cause you to bleed more easily.  Tell all your health care providers, including dentists and eye doctors, about your condition.   Contact a health care provider if you have:  Unexplained bruising. Get help right away if you have:  Active bleeding from anywhere on your body.  Blood in your sputum, urine, or stool. Summary  Thrombocytopenia is a condition in which you have a low number of platelets in your blood.  Platelets are needed for blood clotting.  Symptoms of this condition are the result of poor blood  clotting and may include abnormal bleeding, nosebleeds, and bruising.  This condition may be diagnosed with blood tests and a physical exam.  Treatment for this condition depends on the cause. This information is not intended to replace advice given to  you by your health care provider. Make sure you discuss any questions you have with your health care provider. Document Revised: 08/11/2018 Document Reviewed: 08/11/2018 Elsevier Patient Education  2021 [employer].   Hypomagnesemia Hypomagnesemia is a condition in which the level of magnesium in the blood is low. Magnesium is a mineral that is found in many foods. It is used in many different processes in the body. Hypomagnesemia can affect every organ in the body. In severe cases, it can cause life-threatening problems. What are the causes? This condition may be caused by:  Not getting enough magnesium in your diet.  Malnutrition.  Problems with absorbing magnesium from the intestines.  Dehydration.  Alcohol abuse.  Vomiting.  Severe or chronic diarrhea.  Some medicines, including medicines that make you urinate more (diuretics).  Certain diseases, such as kidney disease, diabetes, celiac disease, and overactive thyroid. What are the signs or symptoms? Symptoms of this condition include:  Loss of appetite.  Nausea and vomiting.  Involuntary shaking or trembling of a body part (tremor).  Muscle weakness.  Tingling in the arms and legs.  Sudden tightening of muscles (muscle spasms).  Confusion.  Psychiatric issues, such as depression, irritability, or psychosis.  A feeling of fluttering of the heart.  Seizures. These symptoms are more severe if magnesium levels drop suddenly. How is this diagnosed? This condition may be diagnosed based on:  Your symptoms and medical history.  A physical exam.  Blood and urine tests. How is this treated? Treatment depends on the cause and the severity of the condition. It may be treated with:  A magnesium supplement. This can be taken in pill form. If the condition is severe, magnesium is usually given through an IV.  Changes to your diet. You may be directed to eat foods that have a lot of magnesium, such as green  leafy vegetables, peas, beans, and nuts.  Stopping any intake of alcohol.   Follow these instructions at home:  Make sure that your diet includes foods with magnesium. Foods that have a lot of magnesium in them include: ? Green leafy vegetables, such as spinach and broccoli. ? Beans and peas. ? Nuts and seeds, such as almonds and sunflower seeds. ? Whole grains, such as whole grain bread and fortified cereals.  Take magnesium supplements if your health care provider tells you to do that. Take them as directed.  Take over-the-counter and prescription medicines only as told by your health care provider.  Have your magnesium levels monitored as told by your health care provider.  When you are active, drink fluids that contain electrolytes.  Avoid drinking alcohol.  Keep all follow-up visits as told by your health care provider. This is important.      Contact a health care provider if:  You get worse instead of better.  Your symptoms return. Get help right away if you:  Develop severe muscle weakness.  Have trouble breathing.  Feel that your heart is racing. Summary  Hypomagnesemia is a condition in which the level of magnesium in the blood is low.  Hypomagnesemia can affect every organ in the body.  Treatment may include eating more foods that contain magnesium, taking magnesium supplements, and not drinking alcohol.  Have your magnesium levels monitored  as told by your health care provider. This information is not intended to replace advice given to you by your health care provider. Make sure you discuss any questions you have with your health care provider. Document Revised: 04/11/2020 Document Reviewed: 04/11/2020 Elsevier Patient Education  [employer].

## 2021-02-07 NOTE — Patient Instructions (Signed)
PICC Home Care Guide A peripherally inserted central catheter (PICC) is a form of IV access that allows medicines and IV fluids to be quickly distributed throughout the body. The PICC is a long, thin, flexible tube (catheter) that is inserted into a vein in the upper arm. The catheter ends in a large vein in the chest (superior vena cava, or SVC). After the PICC is inserted, a chest X-ray may be done to make sure that it is in the correct place. A PICC may be placed for different reasons, such as:  To give medicines and liquid nutrition.  To give IV fluids and blood products.  If there is trouble placing a peripheral intravenous (PIV) catheter. If taken care of properly, a PICC can remain in place for several months. Having a PICC can also allow a person to go home from the hospital sooner. Medicine and PICC care can be managed at home by a family member, caregiver, or home health care team. What are the risks? Generally, having a PICC is safe. However, problems may occur, including:  A blood clot (thrombus) forming in or at the tip of the PICC.  A blood clot forming in a vein (deep vein thrombosis) or traveling to the lung (pulmonary embolism).  Inflammation of the vein (phlebitis) in which the PICC is placed.  Infection. Central line associated blood stream infection (CLABSI) is a serious infection that often requires hospitalization.  PICC movement (malposition). The PICC tip may move from its original position due to excessive physical activity, forceful coughing, sneezing, or vomiting.  A break or cut in the PICC. It is important not to use scissors near the PICC.  Nerve or tendon irritation or injury during PICC insertion. How to take care of your PICC Preventing problems  You and any caregivers should wash your hands often with soap. Wash hands: ? Before touching the PICC line or the infusion device. ? Before changing a bandage (dressing).  Flush the PICC as told by your  health care provider. Let your health care provider know right away if the PICC is hard to flush or does not flush. Do not use force to flush the PICC.  Do not use a syringe that is less than 10 mL to flush the PICC.  Avoid blood pressure checks on the arm in which the PICC is placed.  Never pull or tug on the PICC.  Do not take the PICC out yourself. Only a trained clinical professional should remove the PICC.  Use clean and sterile supplies only. Keep the supplies in a dry place. Do not reuse needles, syringes, or any other supplies. Doing that can lead to infection.  Keep pets and children away from your PICC line.  Check the PICC insertion site every day for signs of infection. Check for: ? Leakage. ? Redness, swelling, or pain. ? Fluid or blood. ? Warmth. ? Pus or a bad smell. PICC dressing care  Keep your PICC bandage (dressing) clean and dry to prevent infection.  Do not take baths, swim, or use a hot tub until your health care provider approves. Ask your health care provider if you can take showers. You may only be allowed to take sponge baths for bathing. When you are allowed to shower: ? Ask your health care provider to teach you how to wrap the PICC line. ? Cover the PICC line with clear plastic wrap and tape to keep it dry while showering.  Follow instructions from your health care provider about   how to take care of your insertion site and dressing. Make sure you: ? Wash your hands with soap and water before you change your bandage (dressing). If soap and water are not available, use hand sanitizer. ? Change your dressing as told by your health care provider. ? Leave stitches (sutures), skin glue, or adhesive strips in place. These skin closures may need to stay in place for 2 weeks or longer. If adhesive strip edges start to loosen and curl up, you may trim the loose edges. Do not remove adhesive strips completely unless your health care provider tells you to do  that.  Change your PICC dressing if it becomes loose or wet. General instructions  Carry your PICC identification card or wear a medical alert bracelet at all times.  Keep the tube clamped at all times, unless it is being used.  Carry a smooth-edge clamp with you at all times to place on the tube if it breaks.  Do not use scissors or sharp objects near the tube.  You may bend your arm and move it freely. If your PICC is near or at the bend of your elbow, avoid activity with repeated motion at the elbow.  Avoid lifting heavy objects as told by your health care provider.  Keep all follow-up visits as told by your health care provider. This is important.   Disposal of supplies  Throw away any syringes in a disposal container that is meant for sharp items (sharps container). You can buy a sharps container from a pharmacy, or you can make one by using an empty hard plastic bottle with a cover.  Place any used dressings or infusion bags into a plastic bag. Throw that bag in the trash. Contact a health care provider if:  You have pain in your arm, ear, face, or teeth.  You have a fever or chills.  You have redness, swelling, or pain around the insertion site.  You have fluid or blood coming from the insertion site.  Your insertion site feels warm to the touch.  You have pus or a bad smell coming from the insertion site.  Your skin feels hard and raised around the insertion site. Get help right away if:  Your PICC is accidentally pulled all the way out. If this happens, cover the insertion site with a bandage or gauze dressing. Do not throw the PICC away. Your health care provider will need to check it.  Your PICC was tugged or pulled and has partially come out. Do not  push the PICC back in.  You cannot flush the PICC, it is hard to flush, or the PICC leaks around the insertion site when it is flushed.  You hear a "flushing" sound when the PICC is flushed.  You feel your  heart racing or skipping beats.  There is a hole or tear in the PICC.  You have swelling in the arm in which the PICC was inserted.  You have a red streak going up your arm from where the PICC was inserted. Summary  A peripherally inserted central catheter (PICC) is a long, thin, flexible tube (catheter) that is inserted into a vein in the upper arm.  The PICC is inserted using a sterile technique by a specially trained nurse or physician. Only a trained clinical professional should remove it.  Keep your PICC identification card with you at all times.  Avoid blood pressure checks on the arm in which the PICC is placed.  If cared for   properly, a PICC can remain in place for several months. Having a PICC can also allow a person to go home from the hospital sooner. This information is not intended to replace advice given to you by your health care provider. Make sure you discuss any questions you have with your health care provider. Document Revised: 03/20/2020 Document Reviewed: 03/20/2020 Elsevier Patient Education  2021 [employer]  

## 2021-02-07 NOTE — Telephone Encounter (Signed)
TCT Dr. Romana Juniper office regarding pt's lab results from today. Spoke with Rose and advised of WBC of 0.2 ANC 0.0, HGB 5.6, platelets of 6 and Magnesium of 1.1  Spoke with Rose. Advised that William Fritz would be getting platelet and blood transfusions and Magnesium infusions over today and tomorrow. She states she will inform Dr. Linus Orn

## 2021-02-08 ENCOUNTER — Other Ambulatory Visit: Payer: Self-pay

## 2021-02-08 ENCOUNTER — Inpatient Hospital Stay: Payer: BC Managed Care – PPO

## 2021-02-08 VITALS — BP 101/71 | HR 67 | Temp 98.9°F | Resp 17 | Ht 72.0 in

## 2021-02-08 DIAGNOSIS — C9200 Acute myeloblastic leukemia, not having achieved remission: Secondary | ICD-10-CM | POA: Diagnosis not present

## 2021-02-08 LAB — PREPARE PLATELET PHERESIS: Unit division: 0

## 2021-02-08 LAB — BPAM PLATELET PHERESIS
Blood Product Expiration Date: 202203202359
ISSUE DATE / TIME: 202203181532
Unit Type and Rh: 6200

## 2021-02-08 MED ORDER — SODIUM CHLORIDE 0.9% FLUSH
10.0000 mL | Freq: Once | INTRAVENOUS | Status: AC
Start: 2021-02-08 — End: 2021-02-08
  Administered 2021-02-08: 10 mL
  Filled 2021-02-08: qty 10

## 2021-02-08 MED ORDER — HEPARIN SOD (PORK) LOCK FLUSH 100 UNIT/ML IV SOLN
50.0000 [IU] | Freq: Once | INTRAVENOUS | Status: AC
  Administered 2021-02-08: 250 [IU]
  Filled 2021-02-08: qty 5

## 2021-02-08 NOTE — Patient Instructions (Signed)

## 2021-02-09 LAB — BPAM RBC
Blood Product Expiration Date: 202204172359
Blood Product Expiration Date: 202204172359
ISSUE DATE / TIME: 202203190813
ISSUE DATE / TIME: 202203190813
Unit Type and Rh: 600
Unit Type and Rh: 600

## 2021-02-09 LAB — TYPE AND SCREEN
ABO/RH(D): A NEG
Antibody Screen: NEGATIVE
Unit division: 0
Unit division: 0

## 2021-02-10 ENCOUNTER — Telehealth: Payer: Self-pay | Admitting: Hematology and Oncology

## 2021-02-10 NOTE — Telephone Encounter (Signed)
Checked out appointment. No LOS notes needing to be scheduled. No changes made. 

## 2021-02-10 NOTE — Telephone Encounter (Signed)
Scheduled per sch msg. Called and spoke with Sadine. Confirmed appts

## 2021-02-10 NOTE — Progress Notes (Signed)
Pharmacist Chemotherapy Monitoring - Initial Assessment    Anticipated start date: 02/17/21   Regimen:  . Are orders appropriate based on the patient's diagnosis, regimen, and cycle? Yes . Does the plan date match the patient's scheduled date? Yes . Is the sequencing of drugs appropriate? Yes . Are the premedications appropriate for the patient's regimen? Yes . Prior Authorization for treatment is: Pending o If applicable, is the correct biosimilar selected based on the patient's insurance? not applicable  Organ Function and Labs: Marland Kitchen Are dose adjustments needed based on the patient's renal function, hepatic function, or hematologic function? Yes - f/u ALT & Tbili; if ALT or Tbili increases to >/=2x ULN, delay cycle and do not restart until toxicity resolves.  Incidence ~ 10-15%.  Check w/ Dr. Lorenso Courier  . Are appropriate labs ordered prior to the start of patient's treatment? Yes . Other organ system assessment, if indicated: N/A . The following baseline labs, if indicated, have been ordered: N/A  Dose Assessment: . Are the drug doses appropriate? Yes . Are the following correct: o Drug concentrations Yes o IV fluid compatible with drug Yes o Administration routes Yes o Timing of therapy Yes . If applicable, does the patient have documented access for treatment and/or plans for port-a-cath placement? yes . If applicable, have lifetime cumulative doses been properly documented and assessed? not applicable Lifetime Dose Tracking  No doses have been documented on this patient for the following tracked chemicals: Doxorubicin, Epirubicin, Idarubicin, Daunorubicin, Mitoxantrone, Bleomycin, Oxaliplatin, Carboplatin, Liposomal Doxorubicin  o   Toxicity Monitoring/Prevention: . The patient has the following take home antiemetics prescribed: Ondansetron and Prochlorperazine . The patient has the following take home medications prescribed: VZV prophylaxis . Medication allergies and previous  infusion related reactions, if applicable, have been reviewed and addressed. Yes . The patient's current medication list has been assessed for drug-drug interactions with their chemotherapy regimen. no significant drug-drug interactions were identified on review.  Order Review: . Are the treatment plan orders signed? No . Is the patient scheduled to see a provider prior to their treatment? Yes  I verify that I have reviewed each item in the above checklist and answered each question accordingly.   Kennith Center, Pharm.D., CPP 02/10/2021@2 :46 PM

## 2021-02-11 ENCOUNTER — Inpatient Hospital Stay: Payer: BC Managed Care – PPO

## 2021-02-11 ENCOUNTER — Other Ambulatory Visit: Payer: Self-pay | Admitting: *Deleted

## 2021-02-11 ENCOUNTER — Other Ambulatory Visit: Payer: Self-pay

## 2021-02-11 ENCOUNTER — Telehealth: Payer: Self-pay

## 2021-02-11 ENCOUNTER — Encounter: Payer: Self-pay | Admitting: Hematology and Oncology

## 2021-02-11 DIAGNOSIS — C9200 Acute myeloblastic leukemia, not having achieved remission: Secondary | ICD-10-CM

## 2021-02-11 LAB — CBC WITH DIFFERENTIAL (CANCER CENTER ONLY)
Abs Immature Granulocytes: 0 10*3/uL (ref 0.00–0.07)
Basophils Absolute: 0 10*3/uL (ref 0.0–0.1)
Basophils Relative: 0 %
Eosinophils Absolute: 0 10*3/uL (ref 0.0–0.5)
Eosinophils Relative: 0 %
HCT: 18.2 % — ABNORMAL LOW (ref 39.0–52.0)
Hemoglobin: 6.5 g/dL — CL (ref 13.0–17.0)
Immature Granulocytes: 0 %
Lymphocytes Relative: 95 %
Lymphs Abs: 0.2 10*3/uL — ABNORMAL LOW (ref 0.7–4.0)
MCH: 36.5 pg — ABNORMAL HIGH (ref 26.0–34.0)
MCHC: 35.7 g/dL (ref 30.0–36.0)
MCV: 102.2 fL — ABNORMAL HIGH (ref 80.0–100.0)
Monocytes Absolute: 0 10*3/uL — ABNORMAL LOW (ref 0.1–1.0)
Monocytes Relative: 0 %
Neutro Abs: 0 10*3/uL — CL (ref 1.7–7.7)
Neutrophils Relative %: 5 %
Platelet Count: 19 10*3/uL — ABNORMAL LOW (ref 150–400)
RBC: 1.78 MIL/uL — ABNORMAL LOW (ref 4.22–5.81)
RDW: 20.9 % — ABNORMAL HIGH (ref 11.5–15.5)
WBC Count: 0.2 10*3/uL — CL (ref 4.0–10.5)
nRBC: 0 % (ref 0.0–0.2)

## 2021-02-11 LAB — URIC ACID: Uric Acid, Serum: 4.8 mg/dL (ref 3.7–8.6)

## 2021-02-11 LAB — CMP (CANCER CENTER ONLY)
ALT: 150 U/L — ABNORMAL HIGH (ref 0–44)
AST: 74 U/L — ABNORMAL HIGH (ref 15–41)
Albumin: 2.9 g/dL — ABNORMAL LOW (ref 3.5–5.0)
Alkaline Phosphatase: 115 U/L (ref 38–126)
Anion gap: 8 (ref 5–15)
BUN: 14 mg/dL (ref 6–20)
CO2: 29 mmol/L (ref 22–32)
Calcium: 8.4 mg/dL — ABNORMAL LOW (ref 8.9–10.3)
Chloride: 103 mmol/L (ref 98–111)
Creatinine: 0.87 mg/dL (ref 0.61–1.24)
GFR, Estimated: >60 mL/min (ref >60)
Glucose, Bld: 142 mg/dL — ABNORMAL HIGH (ref 70–99)
Potassium: 3.9 mmol/L (ref 3.5–5.1)
Sodium: 140 mmol/L (ref 135–145)
Total Bilirubin: 1.3 mg/dL — ABNORMAL HIGH (ref 0.3–1.2)
Total Protein: 5.1 g/dL — ABNORMAL LOW (ref 6.5–8.1)

## 2021-02-11 LAB — SAMPLE TO BLOOD BANK

## 2021-02-11 LAB — LACTATE DEHYDROGENASE: LDH: 177 U/L (ref 98–192)

## 2021-02-11 LAB — MAGNESIUM: Magnesium: 1.1 mg/dL — ABNORMAL LOW (ref 1.7–2.4)

## 2021-02-11 LAB — PREPARE RBC (CROSSMATCH)

## 2021-02-11 MED ORDER — SODIUM CHLORIDE 0.9% FLUSH
10.0000 mL | Freq: Once | INTRAVENOUS | Status: AC
  Administered 2021-02-11: 10 mL
  Filled 2021-02-11: qty 10

## 2021-02-11 MED ORDER — SODIUM CHLORIDE 0.9% FLUSH
10.0000 mL | INTRAVENOUS | Status: AC | PRN
  Administered 2021-02-11: 10 mL
  Filled 2021-02-11: qty 10

## 2021-02-11 MED ORDER — SODIUM CHLORIDE 0.9% IV SOLUTION
250.0000 mL | Freq: Once | INTRAVENOUS | Status: AC
  Administered 2021-02-11: 250 mL via INTRAVENOUS
  Filled 2021-02-11: qty 250

## 2021-02-11 NOTE — Telephone Encounter (Signed)
CRITICAL VALUE STICKER  CRITICAL VALUE: WBC = 0.2, Hgb = 6.5 and ANC = 0.0  RECEIVER (on-site recipient of call): Yetta Glassman, [place] NOTIFIED: 02/11/21 at 8:35am  MESSENGER (representative from lab): Pam  MD NOTIFIED: Dr. Lorenso Courier  TIME OF NOTIFICATION: 02/11/21 at 8:38am  RESPONSE: Notification given to Katharine Look, RN for follow-up with provider.

## 2021-02-11 NOTE — Patient Instructions (Signed)

## 2021-02-11 NOTE — Progress Notes (Signed)
Scheduled for 2 units PRBCs today. Per Dr. Benay Spice (MD on call), no premeds needed. Orders confirmed with [place] in [place].

## 2021-02-12 LAB — BPAM RBC
Blood Product Expiration Date: 202204192359
Blood Product Expiration Date: 202204242359
ISSUE DATE / TIME: 202203221028
ISSUE DATE / TIME: 202203221028
Unit Type and Rh: 600
Unit Type and Rh: 600

## 2021-02-12 LAB — TYPE AND SCREEN
ABO/RH(D): A NEG
Antibody Screen: NEGATIVE
Unit division: 0
Unit division: 0

## 2021-02-14 ENCOUNTER — Telehealth: Payer: Self-pay | Admitting: *Deleted

## 2021-02-14 ENCOUNTER — Encounter: Payer: Self-pay | Admitting: Physician Assistant

## 2021-02-14 ENCOUNTER — Encounter: Payer: Self-pay | Admitting: Hematology and Oncology

## 2021-02-14 NOTE — Telephone Encounter (Signed)
Received MY Chart message from pt's caregiver, Drue Stager. Pt was seen at [facility] with Dr. Linus Orn yesterday.  She states that Dr. Linus Orn informed her that pt's chemo here is every 6 weeks not every 4 weeks. Call made to Dr. Romana Juniper office to confirm as pt is scheduled for treatment on Monday, 02/17/21. Spoke with Manuela Schwartz who will check with Dr. Linus Orn and Call back to my direct line of [phone]

## 2021-02-15 NOTE — Progress Notes (Signed)
[place] [phone]   [phone]   PROGRESS NOTE  Patient Care Team: Orson Slick, MD as PCP - General (Hematology and Oncology)  Hematological/Oncological History  # AML s/p MRD PBSCT with Relapse -08/2019: diagnosed with AML in [place]. Was initially treated at [facility]. 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 Started on Venetoclax/Decitabine -01/16/2021: established care with Dr. Linus Orn at [facility] -01/30/2021: established care with Dr. Lorenso Courier at [facility] for co-managed care.    HISTORY OF PRESENTING ILLNESS:  William Fritz 55 y.o. male with medical history significant for AML s/p MRD PBSCT with relapse who presents to for a follow up.     Patient is unaccompanied for this visit.  He reports that his energy levels and appetite are fair.  He is able to complete his ADLs on his own.  Patient reports feeling dehydrated and admits that he needs to drink more fluids.  Patient denies any nausea, vomiting or abdominal pain.  He has constipation with hard stools. His last BM was today. He denies any signs of bleeding including hematochezia, melena, hematuria, epistaxis or gum bleeding. Patient endorses a mild headache when he is hungry that resolves after eating. He denies any fevers, chills, night sweats, chest pain, shortness of breath or cough. He has no other complaints. Rest of the 10 point ROS is below.   MEDICAL HISTORY:  Past Medical History:  Diagnosis Date  . AML (acute myelogenous leukemia) ([place])   . 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  SOCIAL HISTORY: Social History   Socioeconomic History  . Marital status: Single    Spouse name: Not on file  . Number of children: 0  . Years of education: Not on file  . Highest education level: Not  on file  Occupational History  . Not on file  Tobacco Use  . Smoking status: Never Smoker  . Smokeless tobacco: Never Used  Substance and Sexual Activity  . Alcohol use: Never  . Drug use: Not on file  . Sexual activity: Not on file  Other Topics Concern  . Not on file  Social History Narrative  . Not on file   Social Determinants of Health   Financial Resource Strain: Medium Risk  . Difficulty of Paying Living Expenses: Somewhat hard  Food Insecurity: No Food Insecurity  . Worried About Charity fundraiser in the Last Year: Never true  . Ran Out of Food in the Last Year: Never true  Transportation Needs: Unmet Transportation Needs  . Lack of Transportation (Medical): Yes  . Lack of Transportation (Non-Medical): Yes  Physical Activity: Not on file  Stress: Not on file  Social Connections: Not on file  Intimate Partner Violence: Not on file    FAMILY HISTORY: Family History  Problem Relation Age of Onset  . Alzheimer's disease Mother   . Prostate cancer Father   . Healthy Sister   . Gastric cancer Maternal Grandmother     ALLERGIES:  has No Known Allergies.  MEDICATIONS:  Current Outpatient Medications  Medication Sig Dispense Refill  . calcium carbonate (OS-CAL) 1250 (500 Ca) MG chewable tablet Chew 1 tablet (1,250 mg total) by mouth 2 (two) times daily. 90 tablet 1  . Cholecalciferol 50 MCG (2000 UT) TABS Take 1 tablet (2,000 Units total) by mouth  daily. 90 tablet 1  . cyanocobalamin 1000 MCG tablet Take 1 tablet (1,000 mcg total) by mouth daily. 90 tablet 1  . dronabinol (MARINOL) 2.5 MG capsule     . famciclovir (FAMVIR) 500 MG tablet Take 1 tablet (500 mg total) by mouth daily. 90 tablet 1  . hydrocortisone (CORTEF) 10 MG tablet Take 1 tablet (10 mg total) by mouth 2 (two) times daily. 60 tablet 2  . magnesium oxide (MAG-OX) 400 MG tablet Take 800 mg by mouth 3 (three) times daily.    . midodrine (PROAMATINE) 10 MG tablet Take 1 tablet (10 mg total) by mouth 3  (three) times daily. 90 tablet 1  . mirtazapine (REMERON) 15 MG tablet Take 1 tablet (15 mg total) by mouth at bedtime. 90 tablet 1  . nystatin (MYCOSTATIN) 100000 UNIT/ML suspension Take by mouth.    Marland Kitchen omeprazole (PRILOSEC) 40 MG capsule Take 1 capsule (40 mg total) by mouth 2 (two) times daily. 90 capsule 1  . ondansetron (ZOFRAN) 4 MG tablet Take 1 tablet by mouth every 8 (eight) hours as needed.    . prochlorperazine (COMPAZINE) 10 MG tablet Take 10 mg by mouth every 6 (six) hours as needed.    . tacrolimus (PROGRAF) 0.5 MG capsule Take 5 capsules by mouth every morning. 5 capsules every morning and 6 capsules every evening    . ursodiol (ACTIGALL) 300 MG capsule Take 1 capsule (300 mg total) by mouth 3 (three) times daily. 90 capsule 2  . venetoclax (VENCLEXTA) 100 MG tablet Take by mouth.     No current facility-administered medications for this visit.    REVIEW OF SYSTEMS:   Constitutional: ( - ) fevers, ( - )  chills , ( - ) night sweats Eyes: ( - ) blurriness of vision, ( - ) double vision, ( - ) watery eyes Ears, nose, mouth, throat, and face: ( - ) mucositis, ( - ) sore throat Respiratory: ( - ) cough, ( - ) dyspnea, ( - ) wheezes Cardiovascular: ( - ) palpitation, ( - ) chest discomfort, ( - ) lower extremity swelling Gastrointestinal:  ( - ) nausea, ( - ) heartburn, ( - ) change in bowel habits Skin: ( - ) abnormal skin rashes Lymphatics: ( - ) new lymphadenopathy, ( - ) easy bruising Neurological: ( - ) numbness, ( - ) tingling, ( - ) new weaknesses Behavioral/Psych: ( - ) mood change, ( - ) new changes  All other systems were reviewed with the patient and are negative.  PHYSICAL EXAMINATION: ECOG PERFORMANCE STATUS: 1 - Symptomatic but completely ambulatory  Vitals:   02/17/21 0910  BP: 102/70  Pulse: 70  Resp: 11  Temp: 98.6 F (37 C)  SpO2: 100%   Filed Weights   02/17/21 0910  Weight: 175 lb 4.8 oz (79.5 kg)    GENERAL: chronically ill appearing middle  aged male in NAD  SKIN: skin color, texture, turgor are normal, no rashes or significant lesions EYES: conjunctiva are pink and non-injected, sclera clear OROPHARYNX: no exudate, no erythema; lips, buccal mucosa, and tongue normal  LUNGS: clear to auscultation and percussion with normal breathing effort HEART: regular rate & rhythm and no murmurs and no lower extremity edema Musculoskeletal: no cyanosis of digits and no clubbing  PSYCH: alert & oriented x 3, fluent speech NEURO: no focal motor/sensory deficits  LABORATORY DATA:  I have reviewed the data as listed CBC Latest Ref Rng & Units 02/17/2021 02/11/2021 02/07/2021  WBC 4.0 -  10.5 K/uL 0.2(LL) 0.2(LL) 0.2(LL)  Hemoglobin 13.0 - 17.0 g/dL 8.1(L) 6.5(LL) 5.6(LL)  Hematocrit 39.0 - 52.0 % 23.2(L) 18.2(L) 15.2(L)  Platelets 150 - 400 K/uL 51(L) 19(L) 6(LL)    CMP Latest Ref Rng & Units 02/17/2021 02/11/2021 02/07/2021  Glucose 70 - 99 mg/dL 114(H) 142(H) 132(H)  BUN 6 - 20 mg/dL 13 14 20   Creatinine 0.61 - 1.24 mg/dL 0.72 0.87 0.78  Sodium 135 - 145 mmol/L 141 140 136  Potassium 3.5 - 5.1 mmol/L 4.2 3.9 4.1  Chloride 98 - 111 mmol/L 103 103 102  CO2 22 - 32 mmol/L 30 29 29   Calcium 8.9 - 10.3 mg/dL 8.3(L) 8.4(L) 8.7(L)  Total Protein 6.5 - 8.1 g/dL 5.1(L) 5.1(L) 5.6(L)  Total Bilirubin 0.3 - 1.2 mg/dL 1.4(H) 1.3(H) 1.4(H)  Alkaline Phos 38 - 126 U/L 124 115 124  AST 15 - 41 U/L 44(H) 74(H) 79(H)  ALT 0 - 44 U/L 101(H) 150(H) 151(H)    RADIOGRAPHIC STUDIES: DG Chest 1 View  Result Date: 02/02/2021 CLINICAL DATA:  Leukemia, assess port position EXAM: CHEST  1 VIEW COMPARISON:  None. FINDINGS: Right internal jugular central venous catheter terminates in the lower third of the SVC. Normal heart size. Normal mediastinal contour. No pneumothorax. No pleural effusion. Lungs appear clear, with no acute consolidative airspace disease and no pulmonary edema. IMPRESSION: Right internal jugular central venous catheter terminates in the lower  third of the SVC. No pneumothorax. No active cardiopulmonary disease. Electronically Signed   By: Ilona Sorrel M.D.   On: 02/02/2021 20:58    ASSESSMENT & PLAN William Fritz 55 y.o. male with medical history significant for AML s/p MRD PBSCT with relapse who presents to establish for comanage care.  After review the labs, the records, schedule the patient the findings most consistent with a relapsed AML status post stem cell transplant.  He is currently on palliative chemotherapy with decitabine and venetoclax.  He received his last cycle at [facility] from 2/28 to 3/4. His next cycle (Cycle 5) is due 03/03/2021 and we will administer this here.   On exam today, William Fritz is reporting stable but persistent fatigue and appetite loss. Reviewed labs from today that showed [place] is still 0.0 but improvement of anemia and thrombocytopenia. In addition, patient continues to have hypomagnesemia while on oral magnesium TID so we will infuse 4 gm of IV magnesium today. Patient will return twice weekly for labs, IV flush and possible infusion. He will return for Cycle 5 of decitabine and venetoclax on 03/03/21 with Dr. Lorenso Courier.   # AML s/p MRD PBSCT with Relapse --patient is agreeable to continuing with co-managed care between myself and Dr. Linus Orn of [facility] --agree with continued Decitabine/Venetoclax. Next cycle to be administered on 03/03/2021 --continue with twice weekly labs to monitor counts closely.  --RTC on Monday 03/03/2021 prior to start of next cycle of decitabine.   # Hypomagnesemia:  --Currently taking oral magnesium oxide 400 mg BID --Today's magnesium level is 1.0 so we will administer 4 gm of IV magnesium today.   # Constipation: --Advised to increase oral fluid intake and try OTC miralax/senakot PRN.   No orders of the defined types were placed in this encounter.   All questions were answered. The patient knows to call the clinic with any problems, questions or  concerns.  A total of 30 minutes were spent on this encounter and over half of that time was spent on counseling and coordination of care as  outlined above.    Dede Query PA-C Department of Hematology/Oncology [place] at [facility] Phone: [phone]  02/17/2021 9:42 AM   Patient was seen with Dr. Lorenso Courier.   I have read the above note and personally examined the patient. I agree with the assessment and plan as noted above.  Briefly William Fritz is a 55 year old male with history of relapsed AML who is currently being co-managed with Dr. Linus Orn of [facility]. Per Dr. Romana Juniper recommendations we will hold off on his next cycle of decitabine for another 2 weeks. Plan for interval twice weekly labs for transfusion/magnesium with a follow up clinic visit prior to the start of his next cycle of decitabine.    Ledell Peoples, MD Department of Hematology/Oncology [place] at [facility] Phone: [phone] Pager: [phone] Email: [email or web address]

## 2021-02-17 ENCOUNTER — Inpatient Hospital Stay (HOSPITAL_BASED_OUTPATIENT_CLINIC_OR_DEPARTMENT_OTHER): Payer: BC Managed Care – PPO | Admitting: Physician Assistant

## 2021-02-17 ENCOUNTER — Other Ambulatory Visit: Payer: Self-pay

## 2021-02-17 ENCOUNTER — Inpatient Hospital Stay: Payer: BC Managed Care – PPO

## 2021-02-17 ENCOUNTER — Encounter: Payer: Self-pay | Admitting: Physician Assistant

## 2021-02-17 ENCOUNTER — Telehealth: Payer: Self-pay

## 2021-02-17 ENCOUNTER — Encounter: Payer: Self-pay | Admitting: General Practice

## 2021-02-17 DIAGNOSIS — C9200 Acute myeloblastic leukemia, not having achieved remission: Secondary | ICD-10-CM

## 2021-02-17 DIAGNOSIS — K5900 Constipation, unspecified: Secondary | ICD-10-CM | POA: Insufficient documentation

## 2021-02-17 DIAGNOSIS — K5909 Other constipation: Secondary | ICD-10-CM

## 2021-02-17 LAB — CBC WITH DIFFERENTIAL (CANCER CENTER ONLY)
Abs Immature Granulocytes: 0 10*3/uL (ref 0.00–0.07)
Basophils Absolute: 0 10*3/uL (ref 0.0–0.1)
Basophils Relative: 0 %
Eosinophils Absolute: 0 10*3/uL (ref 0.0–0.5)
Eosinophils Relative: 0 %
HCT: 23.2 % — ABNORMAL LOW (ref 39.0–52.0)
Hemoglobin: 8.1 g/dL — ABNORMAL LOW (ref 13.0–17.0)
Immature Granulocytes: 0 %
Lymphocytes Relative: 85 %
Lymphs Abs: 0.2 10*3/uL — ABNORMAL LOW (ref 0.7–4.0)
MCH: 34.5 pg — ABNORMAL HIGH (ref 26.0–34.0)
MCHC: 34.9 g/dL (ref 30.0–36.0)
MCV: 98.7 fL (ref 80.0–100.0)
Monocytes Absolute: 0 10*3/uL — ABNORMAL LOW (ref 0.1–1.0)
Monocytes Relative: 5 %
Neutro Abs: 0 10*3/uL — CL (ref 1.7–7.7)
Neutrophils Relative %: 10 %
Platelet Count: 51 10*3/uL — ABNORMAL LOW (ref 150–400)
RBC: 2.35 MIL/uL — ABNORMAL LOW (ref 4.22–5.81)
RDW: 22.9 % — ABNORMAL HIGH (ref 11.5–15.5)
WBC Count: 0.2 10*3/uL — CL (ref 4.0–10.5)
nRBC: 0 % (ref 0.0–0.2)

## 2021-02-17 LAB — CMP (CANCER CENTER ONLY)
ALT: 101 U/L — ABNORMAL HIGH (ref 0–44)
AST: 44 U/L — ABNORMAL HIGH (ref 15–41)
Albumin: 2.9 g/dL — ABNORMAL LOW (ref 3.5–5.0)
Alkaline Phosphatase: 124 U/L (ref 38–126)
Anion gap: 8 (ref 5–15)
BUN: 13 mg/dL (ref 6–20)
CO2: 30 mmol/L (ref 22–32)
Calcium: 8.3 mg/dL — ABNORMAL LOW (ref 8.9–10.3)
Chloride: 103 mmol/L (ref 98–111)
Creatinine: 0.72 mg/dL (ref 0.61–1.24)
GFR, Estimated: >60 mL/min (ref >60)
Glucose, Bld: 114 mg/dL — ABNORMAL HIGH (ref 70–99)
Potassium: 4.2 mmol/L (ref 3.5–5.1)
Sodium: 141 mmol/L (ref 135–145)
Total Bilirubin: 1.4 mg/dL — ABNORMAL HIGH (ref 0.3–1.2)
Total Protein: 5.1 g/dL — ABNORMAL LOW (ref 6.5–8.1)

## 2021-02-17 LAB — SAMPLE TO BLOOD BANK

## 2021-02-17 LAB — LACTATE DEHYDROGENASE: LDH: 180 U/L (ref 98–192)

## 2021-02-17 LAB — URIC ACID: Uric Acid, Serum: 3.6 mg/dL — ABNORMAL LOW (ref 3.7–8.6)

## 2021-02-17 LAB — MAGNESIUM: Magnesium: 1 mg/dL — ABNORMAL LOW (ref 1.7–2.4)

## 2021-02-17 MED ORDER — SODIUM CHLORIDE 0.9 % IV SOLN
Freq: Once | INTRAVENOUS | Status: AC
  Filled 2021-02-17: qty 250

## 2021-02-17 MED ORDER — MAGNESIUM SULFATE 2 GM/50ML IV SOLN
INTRAVENOUS | Status: AC
  Filled 2021-02-17: qty 100

## 2021-02-17 MED ORDER — MAGNESIUM SULFATE 4 GM/100ML IV SOLN
4.0000 g | Freq: Once | INTRAVENOUS | Status: AC
  Administered 2021-02-17: 4 g via INTRAVENOUS
  Filled 2021-02-17: qty 100

## 2021-02-17 MED ORDER — SODIUM CHLORIDE 0.9% FLUSH
10.0000 mL | Freq: Once | INTRAVENOUS | Status: AC
  Administered 2021-02-17: 10 mL
  Filled 2021-02-17: qty 10

## 2021-02-17 NOTE — Patient Instructions (Signed)
Magnesium Sulfate injection What is this medicine? MAGNESIUM SULFATE (mag NEE zee um SUL fate) is an electrolyte injection commonly used to treat low magnesium levels in your blood. It is also used to prevent or control seizures in women with preeclampsia or eclampsia. This medicine may be used for other purposes; ask your health care provider or pharmacist if you have questions. What should I tell my health care provider before I take this medicine? They need to know if you have any of these conditions:  heart disease  history of irregular heart beat  kidney disease  an unusual or allergic reaction to magnesium sulfate, medicines, foods, dyes, or preservatives  pregnant or trying to get pregnant  breast-feeding How should I use this medicine? This medicine is for infusion into a vein. It is given by a health care professional in a hospital or clinic setting. Talk to your pediatrician regarding the use of this medicine in children. While this drug may be prescribed for selected conditions, precautions do apply. Overdosage: If you think you have taken too much of this medicine contact a poison control center or emergency room at once. NOTE: This medicine is only for you. Do not share this medicine with others. What if I miss a dose? This does not apply. What may interact with this medicine? This medicine may interact with the following medications:  certain medicines for anxiety or sleep  certain medicines for seizures like phenobarbital  digoxin  medicines that relax muscles for surgery  narcotic medicines for pain This list may not describe all possible interactions. Give your health care provider a list of all the medicines, herbs, non-prescription drugs, or dietary supplements you use. Also tell them if you smoke, drink alcohol, or use illegal drugs. Some items may interact with your medicine. What should I watch for while using this medicine? Your condition will be  monitored carefully while you are receiving this medicine. You may need blood work done while you are receiving this medicine. What side effects may I notice from receiving this medicine? Side effects that you should report to your doctor or health care professional as soon as possible:  allergic reactions like skin rash, itching or hives, swelling of the face, lips, or tongue  facial flushing  muscle weakness  signs and symptoms of low blood pressure like dizziness; feeling faint or lightheaded, falls; unusually weak or tired  signs and symptoms of a dangerous change in heartbeat or heart rhythm like chest pain; dizziness; fast or irregular heartbeat; palpitations; breathing problems  sweating This list may not describe all possible side effects. Call your doctor for medical advice about side effects. You may report side effects to FDA at 1-800-FDA-1088. Where should I keep my medicine? This drug is given in a hospital or clinic and will not be stored at home. NOTE: This sheet is a summary. It may not cover all possible information. If you have questions about this medicine, talk to your doctor, pharmacist, or health care provider.  2021 Elsevier/Gold Standard (2016-05-27 12:31:42)  

## 2021-02-17 NOTE — Progress Notes (Signed)
[place] Spiritual Care Note  Followed up with Mr Willmore in infusion, providing opportunity for him to share and process significant life experiences, including his time in [place] at age 55 and what he learned from someone who raced sled dogs and was an . We also shared a time of prayer. I plan to bring a rosary per request at our next visit in infusion.   [place], [place], [facility] Pager [phone] Voicemail [phone]

## 2021-02-17 NOTE — Telephone Encounter (Signed)
CRITICAL VALUE STICKER  CRITICAL VALUE: WBC = 0.2, HGB = 8, PLT = 51  RECEIVER (on-site recipient of call): Yetta Glassman, [place] NOTIFIED: 02/17/21 at 8:54am  MESSENGER (representative from lab): Pam  MD NOTIFIED: Dede Query, PA-C  TIME OF NOTIFICATION:  02/17/21 at 8:57am  RESPONSE: Pt is present is for an office visit. Notification given to [facility], PA-C for follow-up with pt.

## 2021-02-17 NOTE — Patient Instructions (Signed)
Implanted Port Insertion, Care After This sheet gives you information about how to care for yourself after your procedure. Your health care provider may also give you more specific instructions. If you have problems or questions, contact your health care provider. What can I expect after the procedure? After the procedure, it is common to have:  Discomfort at the port insertion site.  Bruising on the skin over the port. This should improve over 3-4 days. Follow these instructions at home: Port care  After your port is placed, you will get a manufacturer's information card. The card has information about your port. Keep this card with you at all times.  Take care of the port as told by your health care provider. Ask your health care provider if you or a family member can get training for taking care of the port at home. A home health care nurse may also take care of the port.  Make sure to remember what type of port you have. Incision care  Follow instructions from your health care provider about how to take care of your port insertion site. Make sure you: ? Wash your hands with soap and water before and after you change your bandage (dressing). If soap and water are not available, use hand sanitizer. ? Change your dressing as told by your health care provider. ? Leave stitches (sutures), skin glue, or adhesive strips in place. These skin closures may need to stay in place for 2 weeks or longer. If adhesive strip edges start to loosen and curl up, you may trim the loose edges. Do not remove adhesive strips completely unless your health care provider tells you to do that.  Check your port insertion site every day for signs of infection. Check for: ? Redness, swelling, or pain. ? Fluid or blood. ? Warmth. ? Pus or a bad smell.      Activity  Return to your normal activities as told by your health care provider. Ask your health care provider what activities are safe for you.  Do not  lift anything that is heavier than 10 lb (4.5 kg), or the limit that you are told, until your health care provider says that it is safe. General instructions  Take over-the-counter and prescription medicines only as told by your health care provider.  Do not take baths, swim, or use a hot tub until your health care provider approves. Ask your health care provider if you may take showers. You may only be allowed to take sponge baths.  Do not drive for 24 hours if you were given a sedative during your procedure.  Wear a medical alert bracelet in case of an emergency. This will tell any health care providers that you have a port.  Keep all follow-up visits as told by your health care provider. This is important. Contact a health care provider if:  You cannot flush your port with saline as directed, or you cannot draw blood from the port.  You have a fever or chills.  You have redness, swelling, or pain around your port insertion site.  You have fluid or blood coming from your port insertion site.  Your port insertion site feels warm to the touch.  You have pus or a bad smell coming from the port insertion site. Get help right away if:  You have chest pain or shortness of breath.  You have bleeding from your port that you cannot control. Summary  Take care of the port as told by your   health care provider. Keep the manufacturer's information card with you at all times.  Change your dressing as told by your health care provider.  Contact a health care provider if you have a fever or chills or if you have redness, swelling, or pain around your port insertion site.  Keep all follow-up visits as told by your health care provider. This information is not intended to replace advice given to you by your health care provider. Make sure you discuss any questions you have with your health care provider. Document Revised: 06/07/2018 Document Reviewed: 06/07/2018 Elsevier Patient Education   2021 Elsevier Inc.  

## 2021-02-18 ENCOUNTER — Inpatient Hospital Stay: Payer: BC Managed Care – PPO

## 2021-02-18 ENCOUNTER — Telehealth: Payer: Self-pay | Admitting: Hematology and Oncology

## 2021-02-18 ENCOUNTER — Encounter: Payer: Self-pay | Admitting: Physician Assistant

## 2021-02-18 NOTE — Telephone Encounter (Signed)
Scheduled appt per 3/28 sch msg. Pt aware.  

## 2021-02-19 ENCOUNTER — Other Ambulatory Visit: Payer: Self-pay | Admitting: *Deleted

## 2021-02-19 ENCOUNTER — Ambulatory Visit: Payer: BC Managed Care – PPO

## 2021-02-19 DIAGNOSIS — C9200 Acute myeloblastic leukemia, not having achieved remission: Secondary | ICD-10-CM

## 2021-02-20 ENCOUNTER — Ambulatory Visit: Payer: BC Managed Care – PPO

## 2021-02-21 ENCOUNTER — Inpatient Hospital Stay: Payer: BC Managed Care – PPO

## 2021-02-21 ENCOUNTER — Inpatient Hospital Stay: Payer: BC Managed Care – PPO | Attending: Hematology and Oncology

## 2021-02-21 ENCOUNTER — Other Ambulatory Visit: Payer: Self-pay | Admitting: Hematology and Oncology

## 2021-02-21 ENCOUNTER — Other Ambulatory Visit: Payer: Self-pay

## 2021-02-21 DIAGNOSIS — Z8042 Family history of malignant neoplasm of prostate: Secondary | ICD-10-CM | POA: Diagnosis not present

## 2021-02-21 DIAGNOSIS — K5900 Constipation, unspecified: Secondary | ICD-10-CM | POA: Diagnosis not present

## 2021-02-21 DIAGNOSIS — C9200 Acute myeloblastic leukemia, not having achieved remission: Secondary | ICD-10-CM

## 2021-02-21 DIAGNOSIS — R410 Disorientation, unspecified: Secondary | ICD-10-CM | POA: Insufficient documentation

## 2021-02-21 DIAGNOSIS — Z79899 Other long term (current) drug therapy: Secondary | ICD-10-CM | POA: Diagnosis not present

## 2021-02-21 LAB — CMP (CANCER CENTER ONLY)
ALT: 100 U/L — ABNORMAL HIGH (ref 0–44)
AST: 58 U/L — ABNORMAL HIGH (ref 15–41)
Albumin: 3 g/dL — ABNORMAL LOW (ref 3.5–5.0)
Alkaline Phosphatase: 134 U/L — ABNORMAL HIGH (ref 38–126)
Anion gap: 9 (ref 5–15)
BUN: 12 mg/dL (ref 6–20)
CO2: 30 mmol/L (ref 22–32)
Calcium: 8.4 mg/dL — ABNORMAL LOW (ref 8.9–10.3)
Chloride: 101 mmol/L (ref 98–111)
Creatinine: 0.74 mg/dL (ref 0.61–1.24)
GFR, Estimated: >60 mL/min (ref >60)
Glucose, Bld: 157 mg/dL — ABNORMAL HIGH (ref 70–99)
Potassium: 4 mmol/L (ref 3.5–5.1)
Sodium: 140 mmol/L (ref 135–145)
Total Bilirubin: 1.2 mg/dL (ref 0.3–1.2)
Total Protein: 5.4 g/dL — ABNORMAL LOW (ref 6.5–8.1)

## 2021-02-21 LAB — CBC WITH DIFFERENTIAL (CANCER CENTER ONLY)
Abs Immature Granulocytes: 0 10*3/uL (ref 0.00–0.07)
Basophils Absolute: 0 10*3/uL (ref 0.0–0.1)
Basophils Relative: 0 %
Eosinophils Absolute: 0 10*3/uL (ref 0.0–0.5)
Eosinophils Relative: 0 %
HCT: 24.5 % — ABNORMAL LOW (ref 39.0–52.0)
Hemoglobin: 8.5 g/dL — ABNORMAL LOW (ref 13.0–17.0)
Immature Granulocytes: 0 %
Lymphocytes Relative: 44 %
Lymphs Abs: 0.2 10*3/uL — ABNORMAL LOW (ref 0.7–4.0)
MCH: 34.4 pg — ABNORMAL HIGH (ref 26.0–34.0)
MCHC: 34.7 g/dL (ref 30.0–36.0)
MCV: 99.2 fL (ref 80.0–100.0)
Monocytes Absolute: 0.1 10*3/uL (ref 0.1–1.0)
Monocytes Relative: 29 %
Neutro Abs: 0.1 10*3/uL — CL (ref 1.7–7.7)
Neutrophils Relative %: 27 %
Platelet Count: 73 10*3/uL — ABNORMAL LOW (ref 150–400)
RBC: 2.47 MIL/uL — ABNORMAL LOW (ref 4.22–5.81)
RDW: 23.9 % — ABNORMAL HIGH (ref 11.5–15.5)
WBC Count: 0.5 10*3/uL — CL (ref 4.0–10.5)
nRBC: 0 % (ref 0.0–0.2)

## 2021-02-21 LAB — URIC ACID: Uric Acid, Serum: 3.3 mg/dL — ABNORMAL LOW (ref 3.7–8.6)

## 2021-02-21 LAB — MAGNESIUM: Magnesium: 1 mg/dL — ABNORMAL LOW (ref 1.7–2.4)

## 2021-02-21 LAB — LACTATE DEHYDROGENASE: LDH: 197 U/L — ABNORMAL HIGH (ref 98–192)

## 2021-02-21 LAB — SAMPLE TO BLOOD BANK

## 2021-02-21 MED ORDER — MAGNESIUM SULFATE 2 GM/50ML IV SOLN
2.0000 g | Freq: Once | INTRAVENOUS | Status: AC
  Administered 2021-02-21: 2 g via INTRAVENOUS

## 2021-02-21 MED ORDER — SODIUM CHLORIDE 0.9% FLUSH
10.0000 mL | Freq: Once | INTRAVENOUS | Status: AC
  Administered 2021-02-21: 10 mL
  Filled 2021-02-21: qty 10

## 2021-02-21 MED ORDER — MAGNESIUM SULFATE 2 GM/50ML IV SOLN
INTRAVENOUS | Status: AC
  Filled 2021-02-21: qty 50

## 2021-02-21 MED ORDER — HEPARIN SOD (PORK) LOCK FLUSH 100 UNIT/ML IV SOLN
50.0000 [IU] | Freq: Once | INTRAVENOUS | Status: AC
  Administered 2021-02-21: 50 [IU]
  Filled 2021-02-21: qty 5

## 2021-02-21 MED ORDER — MAGNESIUM SULFATE 4 GM/100ML IV SOLN
4.0000 g | Freq: Once | INTRAVENOUS | Status: DC
  Filled 2021-02-21: qty 100

## 2021-02-21 MED ORDER — HEPARIN SOD (PORK) LOCK FLUSH 100 UNIT/ML IV SOLN
50.0000 [IU] | Freq: Once | INTRAVENOUS | Status: AC
Start: 2021-02-21 — End: 2021-02-21
  Administered 2021-02-21: 50 [IU]
  Filled 2021-02-21: qty 5

## 2021-02-21 MED ORDER — SODIUM CHLORIDE 0.9 % IV SOLN
INTRAVENOUS | Status: DC
  Filled 2021-02-21: qty 250

## 2021-02-21 NOTE — Progress Notes (Signed)
Pt received 4g Mg this visit.  RN changed Trialaysis dressing and replaced biopatch. Caps changed, Cath sutures remained in place. Lines flushed, heparin locked, and curros caps applied.

## 2021-02-21 NOTE — Patient Instructions (Signed)
[employer], Adult A central line is a long, thin tube (catheter) that can be used to collect blood for testing or to give medicine through a vein. The tip of the central line ends in a large vein just above the heart (vena cava). A central line may be placed because:  You need to get medicines or fluids through an IV for a long period of time.  You need nutrition but cannot eat or absorb nutrients.  The veins in your hands or arms are difficult to use for IV access.  You need a blood transfusion.  You need chemotherapy or dialysis. Types of central lines There are four main types of central lines:  Peripherally inserted central catheter (PICC) line. This type is used for access of one week or longer. It can be used to draw blood and give fluids or medicines. A PICC looks like an IV tube, but it goes up the arm to the heart. It is usually inserted in the upper arm and taped in place on the arm.  Tunneled central line. This type is used for long-term therapy and dialysis. It is placed in a large vein in the neck, chest, or groin. It is inserted through a small incision made over the vein, and then it is advanced to the heart. It is tunneled under the skin and brought out through a second incision.  Non-tunneled central line. This type is used for short-term access, usually for a maximum of 7 days. It is often used in the emergency department. It is inserted in the neck, chest, or groin.  Implanted port. This type is used for long-term therapy. It can stay in place longer than other types of central lines. It is normally inserted in the upper chest, but it can also be placed in the upper arm or the abdomen. It is inserted and removed with surgery, and it is accessed using a needle. The type of central line that you receive depends on how long you will need it, your medical condition, and the condition of your veins.   Tell a health care provider about:  Any allergies you have.  All  medicines you are taking, including vitamins, herbs, eye drops, creams, and over-the-counter medicines.  Any problems you or family members have had with anesthetic medicines.  Any blood disorders you have.  Any surgeries you have had.  Any medical conditions you have.  Whether you are pregnant or may be pregnant. What are the risks? Generally, placement and use of a central line is safe. However, problems may occur, including:  Infection.  A blood clot that blocks the central line or forms in the vein and travels to the heart.  Bleeding from the place where the central line was inserted.  Developing a hole or crack within the central line. If this happens, the central line will need to be replaced.  Central line failure.  The catheter moving or coming out of place. What happens before the procedure? Medicines Ask your health care provider about:  Changing or stopping your regular medicines. This is especially important if you are taking diabetes medicines or blood thinners.  Taking medicines such as aspirin and ibuprofen. These medicines can thin your blood. Do not take these medicines unless your health care provider tells you to take them.  Taking over-the-counter medicines, vitamins, herbs, and supplements. General instructions  Follow instructions from your health care provider about eating or drinking restrictions.  Ask your health care provider: ? How your  procedure site will be marked. ? What steps will be taken to help prevent infection. These steps may include:  Removing hair at the procedure site.  Washing skin with a germ-killing soap.  Plan to have a responsible adult take you home from the hospital or clinic.  If you will be going home right after the procedure, plan to have a responsible adult care for you for the time you are told. This is important. What happens during the procedure? The procedure will vary depending on the type of central line being  placed. In general:  An IV will be inserted into one of your veins.  You will be given one or more of the following: ? A medicine to help you relax (sedative). ? A medicine to numb the area (local anesthetic).  Your skin will be cleaned with a germ-killing (antiseptic) solution, and you may be covered with sterile drapes.  Your blood pressure, heart rate, breathing rate, and blood oxygen level will be monitored during the procedure.  The central line catheter will be inserted into the vein and advanced to the correct spot. The health care provider may use X-ray equipment to help guide the catheter to the right place.  A bandage (dressing) will be placed over the insertion area. The procedure may vary among health care providers and hospitals. What can I expect after the procedure?  Your blood pressure, heart rate, breathing rate, and blood oxygen level will be monitored until you leave the hospital or clinic.  Antiseptic caps may be placed on the ends of the central line tubing.  If you were given a sedative during the procedure, it can affect you for several hours. Do not drive or operate machinery until your health care provider says that it is safe. Follow these instructions at home: Flushing and cleaning the central line  Follow instructions from your health care provider about flushing and cleaning the central line and the area around it.  Only use sterile supplies to flush the central line. Use supplies from your health care provider, a pharmacy, or another source that is recommended by your health care provider.  Before you flush the central line or clean the central line or the area around it: ? Wash your hands with soap and water for at least 20 seconds. If soap and water are not available, use alcohol-based hand sanitizer. ? Clean the central line hub with rubbing alcohol. Unless directed otherwise by the manufacturer's instructions, scrub using a twisting motion and rub for  10 to 15 seconds or for 30 twists. Be sure you scrub the top of the hub, not just the sides. Never reuse alcohol pads. Let the hub dry before use. Prevent it from touching anything while drying.   Caring for the incision or central line site  Check your incision or central line site every day for signs of infection. Check for: ? Redness, swelling, or pain. ? Fluid or blood. ? Warmth. ? Pus or a bad smell.  Keep the insertion site of your central line clean and dry at all times.  Change your dressing only as told by your health care provider.  Keep your dressing dry. If it gets wet, have it changed as soon as possible. General instructions  Follow instructions from your health care provider for the type of device that you have.  Keep the tube clamped, unless it is being used.  If the central line accidentally gets pulled on, make sure: ? The dressing is okay. ?  There is no bleeding. ? The line has not been pulled out.  Do not use scissors or sharp objects near the tube.  Do not take baths, swim, or use a hot tub until your health care provider approves. Ask your health care provider if you may take showers. You may only be allowed to take sponge baths.  Ask your health care provider what activities are safe for you. You may be restricted from lifting or making repetitive arm movements on the side of your central line.  Take over-the-counter and prescription medicines only as told by your health care provider.  Keep all follow-up visits. This is important. Storage and disposal of supplies  Keep your supplies in a clean, dry location.  Throw away any used syringes in a disposal container that is meant for sharp items (sharps container). You can buy a sharps container from a pharmacy, or you can make one by using an empty hard plastic bottle with a cover.  Place any used dressings or infusion bags into a plastic bag. Throw that bag in the trash. Contact a health care provider  if:  You have redness, swelling, or pain around your insertion site.  You have fluid or blood coming from your insertion site.  Your insertion site feels warm to the touch.  You have pus or a bad smell coming from your insertion site. Get help right away if:  You have: ? A fever or chills. ? Shortness of breath. ? Chest pain or a racing heartbeat. ? Swelling in your neck, face, chest, or arm on the side of your central line.  You feel dizzy or you faint.  Your incision or central line site has red streaks spreading away from the area.  Your incision or central line site is bleeding and does not stop.  Your central line is difficult to flush or will not flush.  You do not get a blood return from the central line.  Your central line gets loose or damaged or comes out.  Your catheter leaks when flushed or when fluids are infused into it. Summary  A central line is a long, thin tube (catheter) that can be used to give medicine through a vein.  Follow specific instructions from your health care provider for the type of device that you have.  Keep the insertion site of your central line clean and dry at all times.  Keep the tube clamped, unless it is being used. This information is not intended to replace advice given to you by your health care provider. Make sure you discuss any questions you have with your health care provider. Document Revised: 07/11/2020 Document Reviewed: 07/11/2020 Elsevier Patient Education  2021 [employer].

## 2021-02-24 ENCOUNTER — Inpatient Hospital Stay: Payer: BC Managed Care – PPO

## 2021-02-24 ENCOUNTER — Encounter: Payer: Self-pay | Admitting: Hematology and Oncology

## 2021-02-24 ENCOUNTER — Telehealth: Payer: Self-pay

## 2021-02-24 ENCOUNTER — Other Ambulatory Visit: Payer: Self-pay

## 2021-02-24 ENCOUNTER — Other Ambulatory Visit (HOSPITAL_COMMUNITY): Payer: Self-pay

## 2021-02-24 DIAGNOSIS — C9200 Acute myeloblastic leukemia, not having achieved remission: Secondary | ICD-10-CM

## 2021-02-24 LAB — CBC WITH DIFFERENTIAL (CANCER CENTER ONLY)
Abs Immature Granulocytes: 0.01 10*3/uL (ref 0.00–0.07)
Basophils Absolute: 0 10*3/uL (ref 0.0–0.1)
Basophils Relative: 0 %
Eosinophils Absolute: 0 10*3/uL (ref 0.0–0.5)
Eosinophils Relative: 0 %
HCT: 23.7 % — ABNORMAL LOW (ref 39.0–52.0)
Hemoglobin: 8.2 g/dL — ABNORMAL LOW (ref 13.0–17.0)
Immature Granulocytes: 1 %
Lymphocytes Relative: 33 %
Lymphs Abs: 0.2 10*3/uL — ABNORMAL LOW (ref 0.7–4.0)
MCH: 35 pg — ABNORMAL HIGH (ref 26.0–34.0)
MCHC: 34.6 g/dL (ref 30.0–36.0)
MCV: 101.3 fL — ABNORMAL HIGH (ref 80.0–100.0)
Monocytes Absolute: 0.2 10*3/uL (ref 0.1–1.0)
Monocytes Relative: 30 %
Neutro Abs: 0.3 10*3/uL — CL (ref 1.7–7.7)
Neutrophils Relative %: 36 %
Platelet Count: 77 10*3/uL — ABNORMAL LOW (ref 150–400)
RBC: 2.34 MIL/uL — ABNORMAL LOW (ref 4.22–5.81)
RDW: 25 % — ABNORMAL HIGH (ref 11.5–15.5)
WBC Count: 0.7 10*3/uL — CL (ref 4.0–10.5)
nRBC: 0 % (ref 0.0–0.2)

## 2021-02-24 LAB — CMP (CANCER CENTER ONLY)
ALT: 105 U/L — ABNORMAL HIGH (ref 0–44)
AST: 61 U/L — ABNORMAL HIGH (ref 15–41)
Albumin: 3.1 g/dL — ABNORMAL LOW (ref 3.5–5.0)
Alkaline Phosphatase: 125 U/L (ref 38–126)
Anion gap: 7 (ref 5–15)
BUN: 12 mg/dL (ref 6–20)
CO2: 31 mmol/L (ref 22–32)
Calcium: 8.4 mg/dL — ABNORMAL LOW (ref 8.9–10.3)
Chloride: 104 mmol/L (ref 98–111)
Creatinine: 0.73 mg/dL (ref 0.61–1.24)
GFR, Estimated: >60 mL/min (ref >60)
Glucose, Bld: 119 mg/dL — ABNORMAL HIGH (ref 70–99)
Potassium: 4.1 mmol/L (ref 3.5–5.1)
Sodium: 142 mmol/L (ref 135–145)
Total Bilirubin: 1 mg/dL (ref 0.3–1.2)
Total Protein: 5.3 g/dL — ABNORMAL LOW (ref 6.5–8.1)

## 2021-02-24 LAB — SAMPLE TO BLOOD BANK

## 2021-02-24 LAB — TACROLIMUS LEVEL: Tacrolimus (FK506) - LabCorp: 5.6 ng/mL (ref 2.0–20.0)

## 2021-02-24 LAB — MAGNESIUM: Magnesium: 1 mg/dL — ABNORMAL LOW (ref 1.7–2.4)

## 2021-02-24 LAB — LACTATE DEHYDROGENASE: LDH: 201 U/L — ABNORMAL HIGH (ref 98–192)

## 2021-02-24 LAB — URIC ACID: Uric Acid, Serum: 4 mg/dL (ref 3.7–8.6)

## 2021-02-24 MED ORDER — HEPARIN SOD (PORK) LOCK FLUSH 100 UNIT/ML IV SOLN
50.0000 [IU] | Freq: Once | INTRAVENOUS | Status: DC
  Filled 2021-02-24: qty 5

## 2021-02-24 MED ORDER — SODIUM CHLORIDE 0.9% FLUSH
10.0000 mL | Freq: Once | INTRAVENOUS | Status: AC
  Administered 2021-02-24: 10 mL
  Filled 2021-02-24: qty 10

## 2021-02-24 MED ORDER — SODIUM CHLORIDE 0.9 % IV SOLN
INTRAVENOUS | Status: DC
  Filled 2021-02-24: qty 250

## 2021-02-24 MED ORDER — HEPARIN SOD (PORK) LOCK FLUSH 100 UNIT/ML IV SOLN
50.0000 [IU] | Freq: Once | INTRAVENOUS | Status: AC
  Administered 2021-02-24: 50 [IU]
  Filled 2021-02-24: qty 5

## 2021-02-24 MED ORDER — SODIUM CHLORIDE 0.9% FLUSH
10.0000 mL | Freq: Once | INTRAVENOUS | Status: DC
  Filled 2021-02-24: qty 10

## 2021-02-24 MED ORDER — MAGNESIUM SULFATE 4 GM/100ML IV SOLN
4.0000 g | Freq: Once | INTRAVENOUS | Status: AC
  Administered 2021-02-24: 4 g via INTRAVENOUS
  Filled 2021-02-24: qty 100

## 2021-02-24 MED ORDER — MAGNESIUM SULFATE 2 GM/50ML IV SOLN
INTRAVENOUS | Status: AC
  Filled 2021-02-24: qty 100

## 2021-02-24 NOTE — Telephone Encounter (Signed)
CRITICAL VALUE STICKER  CRITICAL VALUE: WBC = 0.7, Hgb = 8.2  RECEIVER (on-site recipient of call): Yetta Glassman, CMA  DATE & TIME NOTIFIED: 02/24/21 at 9:12am  MESSENGER (representative from lab): Hillary  MD NOTIFIED: Dr. Lorenso Courier  TIME OF NOTIFICATION: 02/24/21 at 9:15am  RESPONSE: Notification given to directly to Dr. Lorenso Courier for follow-up with pt.

## 2021-02-24 NOTE — Addendum Note (Signed)
Addended by: Tora Kindred on: 02/24/2021 09:57 AM   Modules accepted: Orders

## 2021-02-24 NOTE — Progress Notes (Signed)
Patient's hemoglobin 8.2 this morning. Per Dr Lorenso Courier, no transfusion needed. Labs printed for patient; verbalized understanding. Magnesium 1.0. Per Dr Lorenso Courier, replace 4g IV mag.

## 2021-02-24 NOTE — Patient Instructions (Signed)
Hypomagnesemia Hypomagnesemia is a condition in which the level of magnesium in the blood is low. Magnesium is a mineral that is found in many foods. It is used in many different processes in the body. Hypomagnesemia can affect every organ in the body. In severe cases, it can cause life-threatening problems. What are the causes? This condition may be caused by:  Not getting enough magnesium in your diet.  Malnutrition.  Problems with absorbing magnesium from the intestines.  Dehydration.  Alcohol abuse.  Vomiting.  Severe or chronic diarrhea.  Some medicines, including medicines that make you urinate more (diuretics).  Certain diseases, such as kidney disease, diabetes, celiac disease, and overactive thyroid. What are the signs or symptoms? Symptoms of this condition include:  Loss of appetite.  Nausea and vomiting.  Involuntary shaking or trembling of a body part (tremor).  Muscle weakness.  Tingling in the arms and legs.  Sudden tightening of muscles (muscle spasms).  Confusion.  Psychiatric issues, such as depression, irritability, or psychosis.  A feeling of fluttering of the heart.  Seizures. These symptoms are more severe if magnesium levels drop suddenly. How is this diagnosed? This condition may be diagnosed based on:  Your symptoms and medical history.  A physical exam.  Blood and urine tests. How is this treated? Treatment depends on the cause and the severity of the condition. It may be treated with:  A magnesium supplement. This can be taken in pill form. If the condition is severe, magnesium is usually given through an IV.  Changes to your diet. You may be directed to eat foods that have a lot of magnesium, such as green leafy vegetables, peas, beans, and nuts.  Stopping any intake of alcohol.   Follow these instructions at home:  Make sure that your diet includes foods with magnesium. Foods that have a lot of magnesium in them  include: ? Green leafy vegetables, such as spinach and broccoli. ? Beans and peas. ? Nuts and seeds, such as almonds and sunflower seeds. ? Whole grains, such as whole grain bread and fortified cereals.  Take magnesium supplements if your health care provider tells you to do that. Take them as directed.  Take over-the-counter and prescription medicines only as told by your health care provider.  Have your magnesium levels monitored as told by your health care provider.  When you are active, drink fluids that contain electrolytes.  Avoid drinking alcohol.  Keep all follow-up visits as told by your health care provider. This is important.      Contact a health care provider if:  You get worse instead of better.  Your symptoms return. Get help right away if you:  Develop severe muscle weakness.  Have trouble breathing.  Feel that your heart is racing. Summary  Hypomagnesemia is a condition in which the level of magnesium in the blood is low.  Hypomagnesemia can affect every organ in the body.  Treatment may include eating more foods that contain magnesium, taking magnesium supplements, and not drinking alcohol.  Have your magnesium levels monitored as told by your health care provider. This information is not intended to replace advice given to you by your health care provider. Make sure you discuss any questions you have with your health care provider. Document Revised: 04/11/2020 Document Reviewed: 04/11/2020 Elsevier Patient Education  2021 Elsevier Inc.  

## 2021-02-24 NOTE — Progress Notes (Signed)
Mg = 1. Per Dr. Lorenso Courier, give Mg Sulf 4 g IV today.  Kennith Center, Pharm.D., CPP 02/24/2021@9 :57 AM

## 2021-02-26 LAB — TACROLIMUS LEVEL: Tacrolimus (FK506) - LabCorp: 7.2 ng/mL (ref 2.0–20.0)

## 2021-02-27 ENCOUNTER — Inpatient Hospital Stay: Payer: BC Managed Care – PPO

## 2021-02-27 ENCOUNTER — Inpatient Hospital Stay (HOSPITAL_BASED_OUTPATIENT_CLINIC_OR_DEPARTMENT_OTHER): Payer: BC Managed Care – PPO | Admitting: Hematology and Oncology

## 2021-02-27 ENCOUNTER — Telehealth: Payer: Self-pay | Admitting: *Deleted

## 2021-02-27 ENCOUNTER — Other Ambulatory Visit: Payer: BC Managed Care – PPO

## 2021-02-27 ENCOUNTER — Other Ambulatory Visit: Payer: Self-pay | Admitting: *Deleted

## 2021-02-27 ENCOUNTER — Other Ambulatory Visit: Payer: Self-pay | Admitting: Hematology and Oncology

## 2021-02-27 ENCOUNTER — Other Ambulatory Visit: Payer: Self-pay

## 2021-02-27 VITALS — BP 92/76 | HR 66 | Temp 96.2°F | Resp 17 | Ht 72.0 in | Wt 169.6 lb

## 2021-02-27 DIAGNOSIS — C9200 Acute myeloblastic leukemia, not having achieved remission: Secondary | ICD-10-CM

## 2021-02-27 DIAGNOSIS — Z7189 Other specified counseling: Secondary | ICD-10-CM | POA: Diagnosis not present

## 2021-02-27 LAB — CBC WITH DIFFERENTIAL (CANCER CENTER ONLY)
Abs Immature Granulocytes: 0 10*3/uL (ref 0.00–0.07)
Band Neutrophils: 7 %
Basophils Absolute: 0 10*3/uL (ref 0.0–0.1)
Basophils Relative: 0 %
Eosinophils Absolute: 0 10*3/uL (ref 0.0–0.5)
Eosinophils Relative: 0 %
HCT: 24.4 % — ABNORMAL LOW (ref 39.0–52.0)
Hemoglobin: 8.3 g/dL — ABNORMAL LOW (ref 13.0–17.0)
Lymphocytes Relative: 29 %
Lymphs Abs: 0.3 10*3/uL — ABNORMAL LOW (ref 0.7–4.0)
MCH: 35.3 pg — ABNORMAL HIGH (ref 26.0–34.0)
MCHC: 34 g/dL (ref 30.0–36.0)
MCV: 103.8 fL — ABNORMAL HIGH (ref 80.0–100.0)
Monocytes Absolute: 0.1 10*3/uL (ref 0.1–1.0)
Monocytes Relative: 12 %
Neutro Abs: 0.6 10*3/uL — ABNORMAL LOW (ref 1.7–7.7)
Neutrophils Relative %: 52 %
Platelet Count: 89 10*3/uL — ABNORMAL LOW (ref 150–400)
RBC: 2.35 MIL/uL — ABNORMAL LOW (ref 4.22–5.81)
RDW: 26.2 % — ABNORMAL HIGH (ref 11.5–15.5)
WBC Count: 1.1 10*3/uL — ABNORMAL LOW (ref 4.0–10.5)
nRBC: 0 % (ref 0.0–0.2)

## 2021-02-27 LAB — CMP (CANCER CENTER ONLY)
ALT: 112 U/L — ABNORMAL HIGH (ref 0–44)
AST: 66 U/L — ABNORMAL HIGH (ref 15–41)
Albumin: 3.2 g/dL — ABNORMAL LOW (ref 3.5–5.0)
Alkaline Phosphatase: 130 U/L — ABNORMAL HIGH (ref 38–126)
Anion gap: 8 (ref 5–15)
BUN: 16 mg/dL (ref 6–20)
CO2: 30 mmol/L (ref 22–32)
Calcium: 8.7 mg/dL — ABNORMAL LOW (ref 8.9–10.3)
Chloride: 101 mmol/L (ref 98–111)
Creatinine: 0.78 mg/dL (ref 0.61–1.24)
GFR, Estimated: >60 mL/min (ref >60)
Glucose, Bld: 129 mg/dL — ABNORMAL HIGH (ref 70–99)
Potassium: 4.5 mmol/L (ref 3.5–5.1)
Sodium: 139 mmol/L (ref 135–145)
Total Bilirubin: 1 mg/dL (ref 0.3–1.2)
Total Protein: 5.6 g/dL — ABNORMAL LOW (ref 6.5–8.1)

## 2021-02-27 LAB — MAGNESIUM: Magnesium: 1.1 mg/dL — ABNORMAL LOW (ref 1.7–2.4)

## 2021-02-27 LAB — URIC ACID: Uric Acid, Serum: 4.1 mg/dL (ref 3.7–8.6)

## 2021-02-27 LAB — LACTATE DEHYDROGENASE: LDH: 184 U/L (ref 98–192)

## 2021-02-27 MED ORDER — HEPARIN SOD (PORK) LOCK FLUSH 100 UNIT/ML IV SOLN
50.0000 [IU] | Freq: Once | INTRAVENOUS | Status: AC
Start: 2021-02-27 — End: 2021-02-27
  Administered 2021-02-27: 50 [IU]
  Filled 2021-02-27: qty 5

## 2021-02-27 MED ORDER — HEPARIN SOD (PORK) LOCK FLUSH 100 UNIT/ML IV SOLN
50.0000 [IU] | Freq: Once | INTRAVENOUS | Status: DC
  Filled 2021-02-27: qty 5

## 2021-02-27 MED ORDER — MAGNESIUM SULFATE 2 GM/50ML IV SOLN
2.0000 g | Freq: Once | INTRAVENOUS | Status: AC
  Administered 2021-02-27: 2 g via INTRAVENOUS

## 2021-02-27 MED ORDER — HEPARIN SOD (PORK) LOCK FLUSH 100 UNIT/ML IV SOLN
50.0000 [IU] | Freq: Once | INTRAVENOUS | Status: AC
  Administered 2021-02-27: 50 [IU]
  Filled 2021-02-27: qty 5

## 2021-02-27 MED ORDER — MAGNESIUM SULFATE 4 GM/100ML IV SOLN: 4.0000 g | Freq: Once | INTRAVENOUS | Status: DC

## 2021-02-27 MED ORDER — SODIUM CHLORIDE 0.9% FLUSH
10.0000 mL | Freq: Once | INTRAVENOUS | Status: AC
Start: 2021-02-27 — End: 2021-02-27
  Administered 2021-02-27: 10 mL
  Filled 2021-02-27: qty 10

## 2021-02-27 MED ORDER — MAGNESIUM SULFATE 2 GM/50ML IV SOLN
INTRAVENOUS | Status: AC
  Filled 2021-02-27: qty 100

## 2021-02-27 MED ORDER — SODIUM CHLORIDE 0.9 % IV SOLN
Freq: Once | INTRAVENOUS | Status: AC
  Filled 2021-02-27: qty 250

## 2021-02-27 NOTE — Progress Notes (Signed)
Pt received 1L NS bolus over 2h and 4g Mg this visit.  RN changed Trialaysis dressing and replaced biopatch. Caps changed, Cath sutures remained in place. Lines flushed, heparin locked, and curros caps applied.

## 2021-02-27 NOTE — Patient Instructions (Signed)
Implanted Port Insertion, Care After This sheet gives you information about how to care for yourself after your procedure. Your health care provider may also give you more specific instructions. If you have problems or questions, contact your health care provider. What can I expect after the procedure? After the procedure, it is common to have:  Discomfort at the port insertion site.  Bruising on the skin over the port. This should improve over 3-4 days. Follow these instructions at home: Port care  After your port is placed, you will get a manufacturer's information card. The card has information about your port. Keep this card with you at all times.  Take care of the port as told by your health care provider. Ask your health care provider if you or a family member can get training for taking care of the port at home. A home health care nurse may also take care of the port.  Make sure to remember what type of port you have. Incision care  Follow instructions from your health care provider about how to take care of your port insertion site. Make sure you: ? Wash your hands with soap and water before and after you change your bandage (dressing). If soap and water are not available, use hand sanitizer. ? Change your dressing as told by your health care provider. ? Leave stitches (sutures), skin glue, or adhesive strips in place. These skin closures may need to stay in place for 2 weeks or longer. If adhesive strip edges start to loosen and curl up, you may trim the loose edges. Do not remove adhesive strips completely unless your health care provider tells you to do that.  Check your port insertion site every day for signs of infection. Check for: ? Redness, swelling, or pain. ? Fluid or blood. ? Warmth. ? Pus or a bad smell.      Activity  Return to your normal activities as told by your health care provider. Ask your health care provider what activities are safe for you.  Do not  lift anything that is heavier than 10 lb (4.5 kg), or the limit that you are told, until your health care provider says that it is safe. General instructions  Take over-the-counter and prescription medicines only as told by your health care provider.  Do not take baths, swim, or use a hot tub until your health care provider approves. Ask your health care provider if you may take showers. You may only be allowed to take sponge baths.  Do not drive for 24 hours if you were given a sedative during your procedure.  Wear a medical alert bracelet in case of an emergency. This will tell any health care providers that you have a port.  Keep all follow-up visits as told by your health care provider. This is important. Contact a health care provider if:  You cannot flush your port with saline as directed, or you cannot draw blood from the port.  You have a fever or chills.  You have redness, swelling, or pain around your port insertion site.  You have fluid or blood coming from your port insertion site.  Your port insertion site feels warm to the touch.  You have pus or a bad smell coming from the port insertion site. Get help right away if:  You have chest pain or shortness of breath.  You have bleeding from your port that you cannot control. Summary  Take care of the port as told by your   health care provider. Keep the manufacturer's information card with you at all times.  Change your dressing as told by your health care provider.  Contact a health care provider if you have a fever or chills or if you have redness, swelling, or pain around your port insertion site.  Keep all follow-up visits as told by your health care provider. This information is not intended to replace advice given to you by your health care provider. Make sure you discuss any questions you have with your health care provider. Document Revised: 06/07/2018 Document Reviewed: 06/07/2018 Elsevier Patient Education   2021 Elsevier Inc.  

## 2021-02-27 NOTE — Progress Notes (Signed)
mag

## 2021-02-27 NOTE — Telephone Encounter (Signed)
Received vm message from pt's companion@ 8:14 am. Returned call to William Fritz. William Fritz states that William Fritz has declined quite a bit over the last 2 -3 days with decreased food and fluid intake, increased weakness and confusion, not able to take his meds as scheduled.  William Fritz does not want to take pt to the ED, but prefers that Dr. Lorenso Fritz see him.  He is scheduled for  Chemo soon and William Fritz does not think he can tolerate more chemo at this point.  Advised that we can see him today, get labs and give IV fluids if needed/desired.  Discussed with William Fritz the potential need for goals of care conversations today. She is in agreement. They have actually been to [facility]  They [facility] facility.  Advised that we can discuss options today if desired.  Dr. Lorenso Fritz made aware  High priority scheduling message sent for labs/flsuh and MD appt for this morning.

## 2021-02-27 NOTE — Patient Instructions (Signed)
Magnesium Sulfate injection What is this medicine? MAGNESIUM SULFATE (mag NEE zee um SUL fate) is an electrolyte injection commonly used to treat low magnesium levels in your blood. It is also used to prevent or control seizures in women with preeclampsia or eclampsia. This medicine may be used for other purposes; ask your health care provider or pharmacist if you have questions. What should I tell my health care provider before I take this medicine? They need to know if you have any of these conditions:  heart disease  history of irregular heart beat  kidney disease  an unusual or allergic reaction to magnesium sulfate, medicines, foods, dyes, or preservatives  pregnant or trying to get pregnant  breast-feeding How should I use this medicine? This medicine is for infusion into a vein. It is given by a health care professional in a hospital or clinic setting. Talk to your pediatrician regarding the use of this medicine in children. While this drug may be prescribed for selected conditions, precautions do apply. Overdosage: If you think you have taken too much of this medicine contact a poison control center or emergency room at once. NOTE: This medicine is only for you. Do not share this medicine with others. What if I miss a dose? This does not apply. What may interact with this medicine? This medicine may interact with the following medications:  certain medicines for anxiety or sleep  certain medicines for seizures like phenobarbital  digoxin  medicines that relax muscles for surgery  narcotic medicines for pain This list may not describe all possible interactions. Give your health care provider a list of all the medicines, herbs, non-prescription drugs, or dietary supplements you use. Also tell them if you smoke, drink alcohol, or use illegal drugs. Some items may interact with your medicine. What should I watch for while using this medicine? Your condition will be  monitored carefully while you are receiving this medicine. You may need blood work done while you are receiving this medicine. What side effects may I notice from receiving this medicine? Side effects that you should report to your doctor or health care professional as soon as possible:  allergic reactions like skin rash, itching or hives, swelling of the face, lips, or tongue  facial flushing  muscle weakness  signs and symptoms of low blood pressure like dizziness; feeling faint or lightheaded, falls; unusually weak or tired  signs and symptoms of a dangerous change in heartbeat or heart rhythm like chest pain; dizziness; fast or irregular heartbeat; palpitations; breathing problems  sweating This list may not describe all possible side effects. Call your doctor for medical advice about side effects. You may report side effects to FDA at 1-800-FDA-1088. Where should I keep my medicine? This drug is given in a hospital or clinic and will not be stored at home. NOTE: This sheet is a summary. It may not cover all possible information. If you have questions about this medicine, talk to your doctor, pharmacist, or health care provider.  2021 Elsevier/Gold Standard (2016-05-27 12:31:42)  

## 2021-02-28 ENCOUNTER — Inpatient Hospital Stay: Payer: BC Managed Care – PPO

## 2021-03-03 ENCOUNTER — Telehealth: Payer: Self-pay | Admitting: Hematology and Oncology

## 2021-03-03 ENCOUNTER — Other Ambulatory Visit: Payer: BC Managed Care – PPO

## 2021-03-03 ENCOUNTER — Ambulatory Visit: Payer: BC Managed Care – PPO | Admitting: Hematology and Oncology

## 2021-03-03 ENCOUNTER — Ambulatory Visit: Payer: BC Managed Care – PPO

## 2021-03-03 NOTE — Telephone Encounter (Signed)
Scheduled per sch msg. Called and left a message for [place].

## 2021-03-04 ENCOUNTER — Encounter: Payer: Self-pay | Admitting: Hematology and Oncology

## 2021-03-04 ENCOUNTER — Ambulatory Visit: Payer: BC Managed Care – PPO

## 2021-03-04 NOTE — Progress Notes (Signed)
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 Was initially treated at [facility]. 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 Started on Venetoclax/Decitabine -01/16/2021: established care with Dr. Linus Orn at [facility] -01/30/2021: established care with Dr. Lorenso Courier at [facility] for co-managed care.  -02/27/2021: patient requested to hold chemotherapy moving forward and transition to supportive care only.   HISTORY OF PRESENTING ILLNESS:  William Fritz 55 y.o. male with medical history significant for AML s/p MRD PBSCT with relapse who presents for a follow up visit.   On exam today Mr. Azahel Belcastro is accompanied by his companion.  He notes that over the last 2 days he has not had anything to drink and has had minimal food intake.  It is reported by his companion that he is having more lethargy and confusion.  He reports he has had similar episodes before in the past which were associated with worsening dehydration.  She also reports that he was shaky and "belligerent" on Tuesday night.  He has had occasional episodes of garbling his words and "feels wobbly" this morning.  Overall his clinical status appears to be worsening.  He denies any fevers, chills, night sweats, chest pain, shortness of breath or cough. He has no other complaints. Rest of the 10 point ROS is below.   The bulk of our discussion focused on the options moving forward.  We discussed continued chemotherapy treatments, supportive care, or transition to hospice.  The patient notes there are still things he would like to accomplish and therefore is more agreeable to supportive care while he gets his affairs in order.  I do believe this is a  reasonable option moving forward given his poor prognosis and rapid decline.  MEDICAL HISTORY:  Past Medical History:  Diagnosis Date  . AML (acute myelogenous leukemia) ([place])   . 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  SOCIAL HISTORY: Social History   Socioeconomic History  . Marital status: Single    Spouse name: Not on file  . Number of children: 0  . Years of education: Not on file  . Highest education level: Not on file  Occupational History  . Not on file  Tobacco Use  . Smoking status: Never Smoker  . Smokeless tobacco: Never Used  Substance and Sexual Activity  . Alcohol use: Never  . Drug use: Not on file  . Sexual activity: Not on file  Other Topics Concern  . Not on file  Social History Narrative  . Not on file   Social Determinants of Health   Financial Resource Strain: Medium Risk  . Difficulty of Paying Living Expenses: Somewhat hard  Food Insecurity: No Food Insecurity  . Worried About Charity fundraiser in the Last Year: Never true  . Ran Out of Food in the Last Year: Never true  Transportation Needs: Unmet Transportation Needs  . Lack of Transportation (Medical): Yes  . Lack of Transportation (Non-Medical): Yes  Physical Activity: Not on file  Stress: Not on file  Social Connections: Not on file  Intimate Partner Violence: Not on file  FAMILY HISTORY: Family History  Problem Relation Age of Onset  . Alzheimer's disease Mother   . Prostate cancer Father   . Healthy Sister   . Gastric cancer Maternal Grandmother     ALLERGIES:  has No Known Allergies.  MEDICATIONS:  Current Outpatient Medications  Medication Sig Dispense Refill  . calcium carbonate (OS-CAL) 1250 (500 Ca) MG chewable tablet Chew 1 tablet (1,250 mg total) by mouth 2 (two) times daily. 90 tablet 1  . Cholecalciferol 50 MCG (2000 UT) TABS  Take 1 tablet (2,000 Units total) by mouth daily. 90 tablet 1  . cyanocobalamin 1000 MCG tablet Take 1 tablet (1,000 mcg total) by mouth daily. 90 tablet 1  . dronabinol (MARINOL) 2.5 MG capsule     . famciclovir (FAMVIR) 500 MG tablet TAKE 1 TABLET BY MOUTH ONCE A DAY 90 tablet 1  . hydrocortisone (CORTEF) 10 MG tablet TAKE 1 TABLET BY MOUTH 2 TIMES DAILY 60 tablet 2  . magnesium oxide (MAG-OX) 400 MG tablet Take 800 mg by mouth 3 (three) times daily.    . midodrine (PROAMATINE) 10 MG tablet TAKE 1 TABLET BY MOUTH 3 TIMES DAILY 90 tablet 1  . mirtazapine (REMERON) 15 MG tablet TAKE 1 TABLET BY MOUTH EVERY NIGHT AT BEDTIME 90 tablet 1  . nystatin (MYCOSTATIN) 100000 UNIT/ML suspension Take by mouth.    Marland Kitchen omeprazole (PRILOSEC) 40 MG capsule TAKE 1 CAPSULE BY MOUTH 2 TIMES DAILY 90 capsule 1  . ondansetron (ZOFRAN) 4 MG tablet Take 1 tablet by mouth every 8 (eight) hours as needed.    . prochlorperazine (COMPAZINE) 10 MG tablet Take 10 mg by mouth every 6 (six) hours as needed.    . tacrolimus (PROGRAF) 0.5 MG capsule Take 5 capsules by mouth every morning. 5 capsules every morning and 6 capsules every evening    . ursodiol (ACTIGALL) 300 MG capsule TAKE 1 CAPSULE BY MOUTH 3 TIMES DAILY 90 capsule 2  . venetoclax (VENCLEXTA) 100 MG tablet Take by mouth.    . vitamin B-12 (CYANOCOBALAMIN) 1000 MCG tablet TAKE 1 TABLET (1,000 MCG TOTAL) BY MOUTH DAILY. 90 tablet 1   No current facility-administered medications for this visit.    REVIEW OF SYSTEMS:   Constitutional: ( - ) fevers, ( - )  chills , ( - ) night sweats Eyes: ( - ) blurriness of vision, ( - ) double vision, ( - ) watery eyes Ears, nose, mouth, throat, and face: ( - ) mucositis, ( - ) sore throat Respiratory: ( - ) cough, ( - ) dyspnea, ( - ) wheezes Cardiovascular: ( - ) palpitation, ( - ) chest discomfort, ( - ) lower extremity swelling Gastrointestinal:  ( - ) nausea, ( - ) heartburn, ( - ) change in bowel habits Skin: ( - )  abnormal skin rashes Lymphatics: ( - ) new lymphadenopathy, ( - ) easy bruising Neurological: ( - ) numbness, ( - ) tingling, ( - ) new weaknesses Behavioral/Psych: ( - ) mood change, ( - ) new changes  All other systems were reviewed with the patient and are negative.  PHYSICAL EXAMINATION: ECOG PERFORMANCE STATUS: 1 - Symptomatic but completely ambulatory  Vitals:   02/27/21 1051  BP: 92/76  Pulse: 66  Resp: 17  Temp: (!) 96.2 F (35.7 C)  SpO2: 98%   Filed Weights   02/27/21 1051  Weight: 169 lb 9.6 oz (76.9 kg)    GENERAL: chronically ill appearing middle aged male in NAD  SKIN:  skin color, texture, turgor are normal, no rashes or significant lesions EYES: conjunctiva are pink and non-injected, sclera clear OROPHARYNX: no exudate, no erythema; lips, buccal mucosa, and tongue normal  LUNGS: clear to auscultation and percussion with normal breathing effort HEART: regular rate & rhythm and no murmurs and no lower extremity edema Musculoskeletal: no cyanosis of digits and no clubbing  PSYCH: alert & oriented x 3, fluent speech NEURO: no focal motor/sensory deficits  LABORATORY DATA:  I have reviewed the data as listed CBC Latest Ref Rng & Units 02/27/2021 02/24/2021 02/21/2021  WBC 4.0 - 10.5 K/uL 1.1(L) 0.7(LL) 0.5(LL)  Hemoglobin 13.0 - 17.0 g/dL 8.3(L) 8.2(L) 8.5(L)  Hematocrit 39.0 - 52.0 % 24.4(L) 23.7(L) 24.5(L)  Platelets 150 - 400 K/uL 89(L) 77(L) 73(L)    CMP Latest Ref Rng & Units 02/27/2021 02/24/2021 02/21/2021  Glucose 70 - 99 mg/dL 129(H) 119(H) 157(H)  BUN 6 - 20 mg/dL 16 12 12   Creatinine 0.61 - 1.24 mg/dL 0.78 0.73 0.74  Sodium 135 - 145 mmol/L 139 142 140  Potassium 3.5 - 5.1 mmol/L 4.5 4.1 4.0  Chloride 98 - 111 mmol/L 101 104 101  CO2 22 - 32 mmol/L 30 31 30   Calcium 8.9 - 10.3 mg/dL 8.7(L) 8.4(L) 8.4(L)  Total Protein 6.5 - 8.1 g/dL 5.6(L) 5.3(L) 5.4(L)  Total Bilirubin 0.3 - 1.2 mg/dL 1.0 1.0 1.2  Alkaline Phos 38 - 126 U/L 130(H) 125 134(H)  AST 15 -  41 U/L 66(H) 61(H) 58(H)  ALT 0 - 44 U/L 112(H) 105(H) 100(H)    RADIOGRAPHIC STUDIES: No results found.  ASSESSMENT & PLAN William Fritz 55 y.o. male with medical history significant for AML s/p MRD PBSCT with relapse who presents for a follow up visit.   After review the labs, the records, schedule the patient the findings most consistent with a relapsed AML status post stem cell transplant.  He was on palliative chemotherapy with decitabine and venetoclax.  He received his last cycle at [facility] from 2/28 to 3/4. His next cycle (Cycle 5) was due 03/03/2021, however given his declining clinical status we held a goals of care discussion during which time he noted he wished to hold on further chemotherapy and receive supportive care only.    On exam today, Mr. Yapp is reporting worsening fatigue, weakness, and appetite loss. Reviewed labs from today that showed [place] is still 0.0 but stable anemia and thrombocytopenia. In addition, patient continues to have hypomagnesemia while on oral magnesium TID so we will infuse 4 gm of IV magnesium as needed. Patient will return twice weekly for labs, IV flush and possible infusion. He will return for a clinic visit on 03/13/21 with Dr. Lorenso Courier.   # AML s/p MRD PBSCT with Relapse --patient is agreeable to continuing with co-managed care between myself and Dr[facility] Linus Orn of [facility] --Patient has decline in clinical status and he was to discuss alternative options to therapy today.  We discussed supportive care, hospice, and continue treat with chemotherapy.  They noted they would like to proceed with supportive care only at this time. --hold further treatments with Decitabine/Venetoclax. --continue with twice weekly labs to monitor counts closely.  --RTC on Monday 03/13/2021 for continued supportive care.  # Hypomagnesemia:  --Currently taking oral magnesium oxide 400 mg BID --continue IV magnesium support during infusion visits/checks for  Hgb/Plt counts  # Constipation: --Advised to increase oral fluid intake and try OTC miralax/senakot PRN.   No orders of the defined types  were placed in this encounter.   All questions were answered. The patient knows to call the clinic with any problems, questions or concerns.  A total of 30 minutes were spent on this encounter and over half of that time was spent on counseling and coordination of care as outlined above.    Ledell Peoples, MD Department of Hematology/Oncology [place] [facility] Phone: [phone] Pager: [phone] Email: [email or web address]  03/04/2021 1:41 PM

## 2021-03-05 ENCOUNTER — Encounter: Payer: Self-pay | Admitting: Hematology and Oncology

## 2021-03-05 ENCOUNTER — Telehealth: Payer: Self-pay | Admitting: *Deleted

## 2021-03-05 ENCOUNTER — Ambulatory Visit: Payer: BC Managed Care – PPO

## 2021-03-05 NOTE — Telephone Encounter (Signed)
TCT patient's companion, Drue Stager regarding planning for future care for William Fritz. No answer but was able to leave vm message for her to call back to [phone]

## 2021-03-05 NOTE — Telephone Encounter (Signed)
Received call back from pt's companion, Drue Stager. We discussed measures that need to be taken as pt is declining. She states he is barely eating a meal a day, is incoherent much of the time, he has several syncopal episodes and is very unsteady on his feet and needs more and more assist. His sister is now with him and has been very helpful with his care needs. Pt has stopped all chemo as of 1 week ago. Advised that patient is in need of hospice care at this time and they will be very helpful in keeping him comfortable and keeping him at home for end of life care. Reviewed hospice services with sabine and she is in agreement for this. She prefers [facility] as she and Zygmunt had toured both [employer] [employer] [facility] and [facility] facilities and preferred [facility]. Advised that I would call in the referral this afternoon and emphasize the need for admission visit tomorrow as pt is declining more every day.  Drue Stager very grateful for all assistance in his care over these last few weeks. Advised that we will keep in touch.   Referral called to [facility] and requested visit tomorrow if at all possible.

## 2021-03-06 ENCOUNTER — Inpatient Hospital Stay: Payer: BC Managed Care – PPO

## 2021-03-06 ENCOUNTER — Other Ambulatory Visit: Payer: BC Managed Care – PPO

## 2021-03-07 ENCOUNTER — Ambulatory Visit: Payer: BC Managed Care – PPO

## 2021-03-08 ENCOUNTER — Other Ambulatory Visit (HOSPITAL_COMMUNITY): Payer: Self-pay

## 2021-03-13 ENCOUNTER — Ambulatory Visit: Payer: BC Managed Care – PPO | Admitting: Hematology and Oncology

## 2021-03-13 ENCOUNTER — Other Ambulatory Visit: Payer: BC Managed Care – PPO

## 2021-03-17 ENCOUNTER — Other Ambulatory Visit: Payer: BC Managed Care – PPO

## 2021-03-17 ENCOUNTER — Ambulatory Visit: Payer: BC Managed Care – PPO

## 2021-03-17 ENCOUNTER — Ambulatory Visit: Payer: BC Managed Care – PPO | Admitting: Hematology and Oncology

## 2021-03-18 ENCOUNTER — Ambulatory Visit: Payer: BC Managed Care – PPO

## 2021-03-19 ENCOUNTER — Ambulatory Visit: Payer: BC Managed Care – PPO

## 2021-03-20 ENCOUNTER — Ambulatory Visit: Payer: BC Managed Care – PPO

## 2021-03-21 ENCOUNTER — Ambulatory Visit: Payer: BC Managed Care – PPO

## 2021-03-24 ENCOUNTER — Other Ambulatory Visit: Payer: Self-pay | Admitting: Hematology and Oncology

## 2021-03-25 ENCOUNTER — Other Ambulatory Visit: Payer: Self-pay | Admitting: Hematology and Oncology

## 2021-04-07 ENCOUNTER — Telehealth: Payer: Self-pay | Admitting: *Deleted

## 2021-04-07 NOTE — Telephone Encounter (Signed)
Received message from weekend on call staff of [facility] that [name] died on May 05, 2021 @ 3:31 am  TCT pt's companion, Drue Stager and spoke with her. She states she is doing ok and is very grateful that she was able to keep William at home with the help of his sister and Hospice.  Expressed gratitude to her for caring for him so very well. Condolences extended from myself and Dr. Lorenso Courier.

## 2021-04-23 DEATH — deceased

## 2022-08-22 IMAGING — DX DG CHEST 1V
1 series · 1 of 1 positions shown · non-contrast
Comparison: None.

CLINICAL DATA: Leukemia, assess port position

EXAM:
CHEST  1 VIEW

[chest ap]
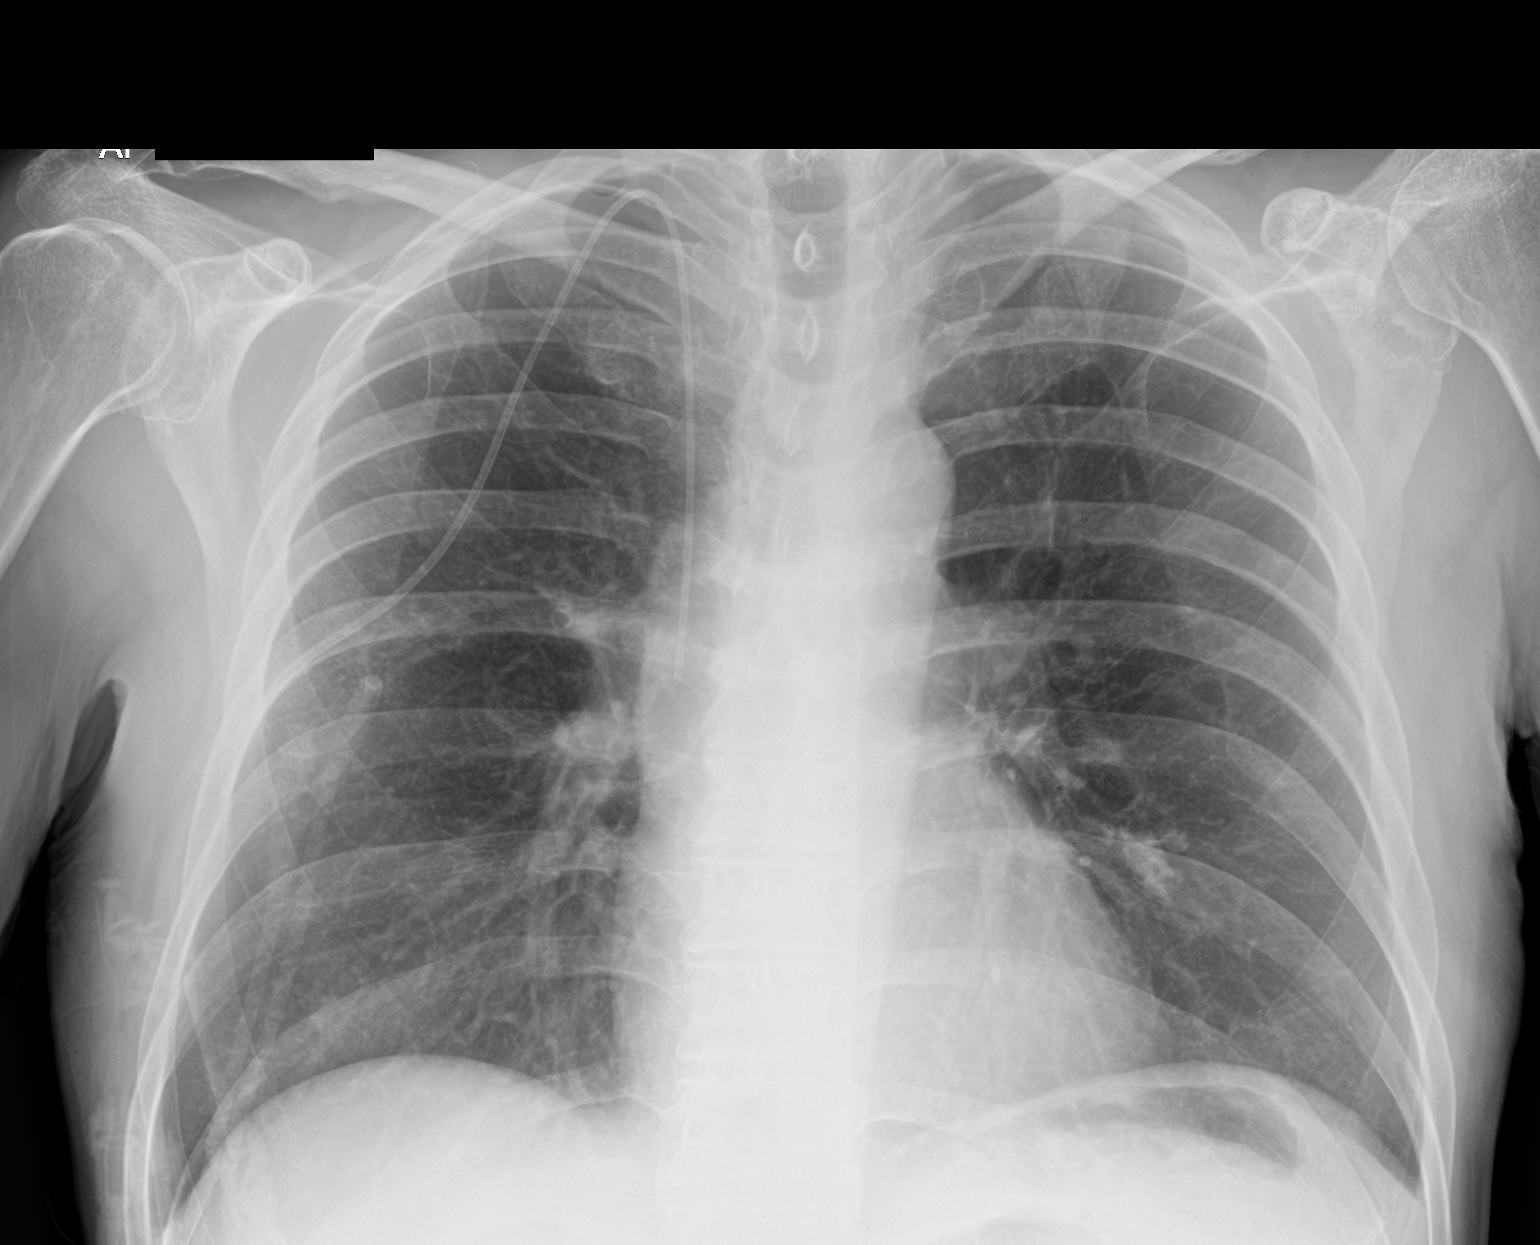

[1 of 1 positions shown; findings below may reference images not displayed]

FINDINGS: Right internal jugular central venous catheter terminates in the
lower third of the SVC. Normal heart size. Normal mediastinal
contour. No pneumothorax. No pleural effusion. Lungs appear clear,
with no acute consolidative airspace disease and no pulmonary edema.
IMPRESSION: Right internal jugular central venous catheter terminates in the
lower third of the SVC. No pneumothorax. No active cardiopulmonary
disease.
